# Patient Record
Sex: Male | Born: 1955 | Race: Black or African American | Hispanic: No | Smoking: Never smoker
Health system: Southern US, Community
[De-identification: ages and names within clinical notes are randomized; demographics above are authoritative.]

## PROBLEM LIST (undated history)

## (undated) DIAGNOSIS — Z8739 Personal history of other diseases of the musculoskeletal system and connective tissue: Secondary | ICD-10-CM

## (undated) DIAGNOSIS — Z973 Presence of spectacles and contact lenses: Secondary | ICD-10-CM

## (undated) DIAGNOSIS — M1990 Unspecified osteoarthritis, unspecified site: Secondary | ICD-10-CM

## (undated) DIAGNOSIS — H548 Legal blindness, as defined in USA: Secondary | ICD-10-CM

## (undated) DIAGNOSIS — Z8546 Personal history of malignant neoplasm of prostate: Secondary | ICD-10-CM

## (undated) DIAGNOSIS — J309 Allergic rhinitis, unspecified: Secondary | ICD-10-CM

## (undated) DIAGNOSIS — H409 Unspecified glaucoma: Secondary | ICD-10-CM

## (undated) DIAGNOSIS — J449 Chronic obstructive pulmonary disease, unspecified: Secondary | ICD-10-CM

## (undated) DIAGNOSIS — G4733 Obstructive sleep apnea (adult) (pediatric): Secondary | ICD-10-CM

## (undated) DIAGNOSIS — J45909 Unspecified asthma, uncomplicated: Secondary | ICD-10-CM

## (undated) DIAGNOSIS — N529 Male erectile dysfunction, unspecified: Secondary | ICD-10-CM

## (undated) DIAGNOSIS — Z9989 Dependence on other enabling machines and devices: Secondary | ICD-10-CM

## (undated) HISTORY — PX: GLAUCOMA SURGERY: SHX656

## (undated) HISTORY — PX: PROSTATECTOMY: SHX69

---

## 2005-02-05 ENCOUNTER — Emergency Department (HOSPITAL_COMMUNITY): Admission: EM | Admit: 2005-02-05 | Discharge: 2005-02-05 | Payer: Self-pay | Admitting: Family Medicine

## 2005-03-07 ENCOUNTER — Ambulatory Visit: Payer: Self-pay | Admitting: Internal Medicine

## 2005-03-20 ENCOUNTER — Ambulatory Visit: Payer: Self-pay | Admitting: *Deleted

## 2005-03-26 ENCOUNTER — Ambulatory Visit: Payer: Self-pay | Admitting: Internal Medicine

## 2005-06-13 ENCOUNTER — Ambulatory Visit: Payer: Self-pay | Admitting: Internal Medicine

## 2005-06-14 ENCOUNTER — Ambulatory Visit (HOSPITAL_COMMUNITY): Admission: RE | Admit: 2005-06-14 | Discharge: 2005-06-14 | Payer: Self-pay | Admitting: Internal Medicine

## 2005-06-18 ENCOUNTER — Ambulatory Visit: Payer: Self-pay | Admitting: Internal Medicine

## 2005-10-10 ENCOUNTER — Ambulatory Visit: Payer: Self-pay | Admitting: Internal Medicine

## 2006-06-08 ENCOUNTER — Emergency Department (HOSPITAL_COMMUNITY): Admission: EM | Admit: 2006-06-08 | Discharge: 2006-06-08 | Payer: Self-pay | Admitting: Emergency Medicine

## 2006-09-11 ENCOUNTER — Ambulatory Visit: Payer: Self-pay | Admitting: Internal Medicine

## 2006-09-12 ENCOUNTER — Inpatient Hospital Stay (HOSPITAL_COMMUNITY): Admission: EM | Admit: 2006-09-12 | Discharge: 2006-09-12 | Payer: Self-pay | Admitting: Emergency Medicine

## 2007-01-14 ENCOUNTER — Encounter (INDEPENDENT_AMBULATORY_CARE_PROVIDER_SITE_OTHER): Payer: Self-pay | Admitting: *Deleted

## 2007-04-17 ENCOUNTER — Ambulatory Visit: Payer: Self-pay | Admitting: Family Medicine

## 2007-04-20 ENCOUNTER — Ambulatory Visit: Payer: Self-pay | Admitting: Internal Medicine

## 2007-04-20 LAB — CONVERTED CEMR LAB
Total CHOL/HDL Ratio: 4.8
Triglycerides: 436 mg/dL — ABNORMAL HIGH (ref <150)

## 2007-05-04 ENCOUNTER — Ambulatory Visit: Payer: Self-pay | Admitting: Internal Medicine

## 2007-05-05 ENCOUNTER — Encounter: Payer: Self-pay | Admitting: Internal Medicine

## 2007-05-05 LAB — CONVERTED CEMR LAB
ALT: 33 units/L (ref 0–53)
Alkaline Phosphatase: 77 units/L (ref 39–117)
BUN: 10 mg/dL (ref 6–23)
Basophils Absolute: 0 10*3/uL (ref 0.0–0.1)
Basophils Relative: 0 % (ref 0–1)
CO2: 23 meq/L (ref 19–32)
Calcium: 9.4 mg/dL (ref 8.4–10.5)
Chloride: 107 meq/L (ref 96–112)
Eosinophils Relative: 7 % — ABNORMAL HIGH (ref 0–5)
Glucose, Bld: 94 mg/dL (ref 70–99)
HCT: 47.3 % (ref 39.0–52.0)
Hemoglobin: 15.3 g/dL (ref 13.0–17.0)
Lymphs Abs: 2.4 10*3/uL (ref 0.7–4.0)
MCHC: 32.3 g/dL (ref 30.0–36.0)
MCV: 95.9 fL (ref 78.0–100.0)
Neutro Abs: 4 10*3/uL (ref 1.7–7.7)
PSA: 3.86 ng/mL (ref 0.10–4.00)
RBC: 4.93 M/uL (ref 4.22–5.81)
RDW: 13.8 % (ref 11.5–15.5)
Total Protein: 7.7 g/dL (ref 6.0–8.3)

## 2007-06-12 ENCOUNTER — Emergency Department (HOSPITAL_COMMUNITY): Admission: EM | Admit: 2007-06-12 | Discharge: 2007-06-12 | Payer: Self-pay | Admitting: Emergency Medicine

## 2007-07-03 ENCOUNTER — Ambulatory Visit: Payer: Self-pay | Admitting: Family Medicine

## 2007-07-08 ENCOUNTER — Encounter: Payer: Self-pay | Admitting: Internal Medicine

## 2007-07-08 ENCOUNTER — Ambulatory Visit: Payer: Self-pay | Admitting: Family Medicine

## 2007-07-08 LAB — CONVERTED CEMR LAB
Cholesterol: 181 mg/dL (ref 0–200)
LDL Cholesterol: 105 mg/dL — ABNORMAL HIGH (ref 0–99)
VLDL: 31 mg/dL (ref 0–40)

## 2007-09-02 ENCOUNTER — Ambulatory Visit: Payer: Self-pay | Admitting: Internal Medicine

## 2008-01-06 ENCOUNTER — Encounter: Payer: Self-pay | Admitting: Family Medicine

## 2008-01-06 ENCOUNTER — Ambulatory Visit: Payer: Self-pay | Admitting: Internal Medicine

## 2008-01-06 LAB — CONVERTED CEMR LAB: Triglycerides: 166 mg/dL — ABNORMAL HIGH (ref <150)

## 2008-08-23 ENCOUNTER — Ambulatory Visit: Payer: Self-pay | Admitting: Internal Medicine

## 2008-10-17 ENCOUNTER — Ambulatory Visit: Payer: Self-pay | Admitting: Family Medicine

## 2009-02-02 ENCOUNTER — Ambulatory Visit: Payer: Self-pay | Admitting: Internal Medicine

## 2009-04-13 ENCOUNTER — Ambulatory Visit: Payer: Self-pay | Admitting: Internal Medicine

## 2009-10-19 ENCOUNTER — Ambulatory Visit: Payer: Self-pay | Admitting: Internal Medicine

## 2009-12-07 ENCOUNTER — Ambulatory Visit: Payer: Self-pay | Admitting: Internal Medicine

## 2010-03-30 ENCOUNTER — Emergency Department (HOSPITAL_COMMUNITY)
Admission: EM | Admit: 2010-03-30 | Discharge: 2010-03-30 | Payer: Self-pay | Source: Home / Self Care | Admitting: Emergency Medicine

## 2010-09-11 NOTE — H&P (Signed)
Derek Martinez, FURUKAWA NO.:  [id number]   MEDICAL RECORD NO.:  [id number]          PATIENT TYPE:  INP   LOCATION:  5727                         FACILITY:  [facility]   PHYSICIAN:  Ladell Pier, M.D.   DATE OF BIRTH:  11-09-1955   DATE OF ADMISSION:  09/11/2006  DATE OF DISCHARGE:                              HISTORY & PHYSICAL   CHIEF COMPLAINT:  Shortness of breath.   HISTORY OF PRESENT ILLNESS:  The patient is a 55 year old African  American male.  Past medical history significant for obstructive sleep  apnea, asthma, and allergic rhinitis.  Per patient, he has been out of  his medications for the past 8 months.  He recently was cutting his  grass and he also was doing some sawing.  After that he noted that he  has become increasingly short of breath.  He had no chest pain.   PAST MEDICAL HISTORY:  Significant for:  1. Asthma.  2. Obstructive sleep apnea, for which he uses a CPAP machine.  3. Allergic rhinitis.  4. Right shoulder dislocation followed by orthopedics.   FAMILY HISTORY:  Noncontributory.   SOCIAL HISTORY:  No tobacco or alcohol use.  He is single with a  significant other.  He has 2 children.  He is self employed.   MEDICATIONS:  He normally takes Advair and Singulair.   ALLERGIES:  PEANUT AND PENICILLIN.   REVIEW OF SYSTEMS:  As per stated in the HPI.   PHYSICAL EXAMINATION:  VITAL SIGNS:  Temperature 97.2, blood pressure  114/71, pulse 118, respirations 18, pulse ox 90% on room air.  HEENT:  Normocephalic, atraumatic.  Pupils equal, round, reactive to  light without erythema.  CARDIOVASCULAR:  Regular rate and rhythm.  LUNGS:  He has prolonged expiratory phase with some expiratory wheezes.  ABDOMEN:  Positive bowel sounds.  EXTREMITIES:  No edema.   IMAGING:  Chest x-ray shows no acute disease.   LABORATORY DATA:  Sodium 139, potassium 3.7, chloride 109, CO2 22, BUN  7, creatinine 0.6, glucose 147.  WBC 13.5, hemoglobin 14.9,  platelets  286.   ASSESSMENT:  1. Asthma exacerbation.  We will admit the patient to the hospital      with intravenous steroids.  I will start him on Z-Pak and albuterol      treatment every 6 hours.  We will also get his peaks q.shift.  2. Obstructive sleep apnea.  We will restart him on his continuous      positive airway pressure machine.  3. Allergic rhinitis.  Start him on Singulair daily.  4. The patient admitted for observation.      Ladell Pier, M.D.  Electronically Signed     NJ/MEDQ  D:  09/12/2006  T:  09/12/2006  Job:  [id number]

## 2010-09-11 NOTE — Discharge Summary (Signed)
[name], Derek NO.:  [id number]   MEDICAL RECORD NO.:  [id number]          PATIENT TYPE:  INP   LOCATION:  5727                         FACILITY:  [facility]   PHYSICIAN:  Altha Harm, MDDATE OF BIRTH:  Jan 28, 1956   DATE OF ADMISSION:  09/11/2006  DATE OF DISCHARGE:  09/12/2006                               DISCHARGE SUMMARY   DISCHARGE DISPOSITION:  Home.   FINAL DISCHARGE DIAGNOSES:  1. Acute exacerbation of asthma.  2. Obstructive sleep apnea.  3. Gastroesophageal reflux disease.  4. Allergic rhinitis.   DISCHARGE MEDICATIONS:  1. Singulair 10 mg p.o. q.a.m.  2. Albuterol MDI 2 puffs q.4 hours p.r.n.  3. Pulmicort 180 mcg per spray, 2 sprays p.o. b.i.d.  4. Zithromax 350 mg p.o. daily x4 days.  5. Prednisone 60 mg p.o. b.i.d. x5 days.  6. Prilosec 40 mg p.o. daily.   CONSULTATIONS:  None.   PROCEDURES:  None.   DIAGNOSTIC STUDIES:  A chest x-ray, which showed no evidence of acute  pneumonic process.   PRIMARY CARE PHYSICIAN:  [facility].   CHIEF COMPLAINT:  Difficulty in breathing.   HOSPITAL COURSE:  The patient was admitted with an acute exacerbation of  asthma.  The patient was started on nebulizer treatments with albuterol  and given IV Solu-Medrol.  The patient improved his breathing and  resolved his hypoxia.  The patient was transitioned to p.o. prednisone  and Advair.  The patient is able to ambulate without difficulty in  breathing, with O2 sats at rest and with ambulation remaining above 95%.  The patient is being discharged on the above medications.  The patient  has received Advair while hospitalized; however, states that he has no  insurance and is unable to afford his Advair as an outpatient.  The  patient will complete the use of the current Advair, and then transition  to albuterol and Pulmicort as his controlling medication.   CONDITION ON DISCHARGE:  Stable.   FOLLOWUP:  The patient is to followup with [facility] in 1 week.      Altha Harm, MD  Electronically Signed     MAM/MEDQ  D:  09/12/2006  T:  09/13/2006  Job:  [id number]

## 2011-08-12 ENCOUNTER — Emergency Department (HOSPITAL_COMMUNITY)
Admission: EM | Admit: 2011-08-12 | Discharge: 2011-08-12 | Disposition: A | Discharge status: Home / Self Care | Payer: Self-pay | Attending: Emergency Medicine | Admitting: Emergency Medicine

## 2011-08-12 ENCOUNTER — Emergency Department (HOSPITAL_COMMUNITY): Payer: Self-pay

## 2011-08-12 ENCOUNTER — Encounter (HOSPITAL_COMMUNITY): Payer: Self-pay

## 2011-08-12 DIAGNOSIS — N454 Abscess of epididymis or testis: Secondary | ICD-10-CM | POA: Insufficient documentation

## 2011-08-12 DIAGNOSIS — H409 Unspecified glaucoma: Secondary | ICD-10-CM | POA: Insufficient documentation

## 2011-08-12 DIAGNOSIS — H543 Unqualified visual loss, both eyes: Secondary | ICD-10-CM | POA: Insufficient documentation

## 2011-08-12 DIAGNOSIS — J45909 Unspecified asthma, uncomplicated: Secondary | ICD-10-CM | POA: Insufficient documentation

## 2011-08-12 LAB — BASIC METABOLIC PANEL
BUN: 6 mg/dL (ref 6–23)
Calcium: 9 mg/dL (ref 8.4–10.5)
Creatinine, Ser: 0.93 mg/dL (ref 0.50–1.35)
GFR calc Af Amer: >90 mL/min (ref >90)
GFR calc non Af Amer: >90 mL/min (ref >90)
Glucose, Bld: 107 mg/dL — ABNORMAL HIGH (ref 70–99)

## 2011-08-12 LAB — DIFFERENTIAL
Basophils Absolute: 0 10*3/uL (ref 0.0–0.1)
Basophils Relative: 0 % (ref 0–1)
Lymphocytes Relative: 15 % (ref 12–46)
Lymphs Abs: 1.4 10*3/uL (ref 0.7–4.0)
Monocytes Absolute: 0.6 10*3/uL (ref 0.1–1.0)
Monocytes Relative: 6 % (ref 3–12)
Neutro Abs: 7 10*3/uL (ref 1.7–7.7)
Neutrophils Relative %: 74 % (ref 43–77)

## 2011-08-12 LAB — CBC
MCH: 31.9 pg (ref 26.0–34.0)
MCHC: 33.5 g/dL (ref 30.0–36.0)
Platelets: 205 10*3/uL (ref 150–400)

## 2011-08-12 MED ORDER — DOXYCYCLINE HYCLATE 100 MG PO CAPS: 100.0000 mg | ORAL_CAPSULE | Freq: Two times a day (BID) | ORAL | Status: AC

## 2011-08-12 MED ORDER — OXYCODONE-ACETAMINOPHEN 5-325 MG PO TABS
1.0000 | ORAL_TABLET | Freq: Once | ORAL | Status: AC
  Administered 2011-08-12: 1 via ORAL
  Filled 2011-08-12: qty 1

## 2011-08-12 MED ORDER — OXYCODONE-ACETAMINOPHEN 5-325 MG PO TABS: 2.0000 | ORAL_TABLET | ORAL | Status: AC | PRN

## 2011-08-12 NOTE — ED Notes (Signed)
Pt has ride coming to pick up

## 2011-08-12 NOTE — ED Notes (Signed)
Returned from ultrasound.

## 2011-08-12 NOTE — ED Notes (Signed)
Patient reporting right sided testicle pain and swelling over past few days; patient reporting an oval shape to his testicle. Patient has history of BPH.

## 2011-08-12 NOTE — ED Notes (Signed)
Patient transported to Ultrasound 

## 2011-08-12 NOTE — ED Provider Notes (Signed)
History     CSN: [id number]  Arrival date & time 08/12/11  4098   First MD Initiated Contact with Patient 08/12/11 1104      Chief Complaint  Patient presents with  . Testicle Pain     Patient is a 56 y.o. male presenting with testicular pain. The history is provided by the patient.  Testicle Pain This is a new problem. The current episode started more than 2 days ago. The problem occurs constantly. The problem has been gradually worsening. Pertinent negatives include no chest pain, no abdominal pain and no shortness of breath. Exacerbated by: standing. The symptoms are relieved by rest. He has tried nothing for the symptoms.  pt reports gradually worsening testicle pain No trauma to groin No fever/vomiting/abd pain No dysuria He has no difficulty urinating Reports he has a "spot" on his scrotum   Past Medical History  Diagnosis Date  . Benign prostatic hypertrophy   . Asthma   . Blind   . Glaucoma     Past Surgical History  Procedure Date  . Glaucoma surgery     No family history on file.  History  Substance Use Topics  . Smoking status: Never Smoker   . Smokeless tobacco: Not on file  . Alcohol Use: Yes      Review of Systems  Respiratory: Negative for shortness of breath.   Cardiovascular: Negative for chest pain.  Gastrointestinal: Negative for abdominal pain.  Genitourinary: Positive for testicular pain.  All other systems reviewed and are negative.    Allergies  Penicillins  Home Medications   Current Outpatient Rx  Name Route Sig Dispense Refill  . ALBUTEROL SULFATE HFA 108 (90 BASE) MCG/ACT IN AERS Inhalation Inhale 2 puffs into the lungs every 6 (six) hours as needed. For shortness of breath    . PREDNISOLONE ACETATE 1 % OP SUSP Right Eye Place 1 drop into the right eye 3 (three) times daily.      BP 129/88  Pulse 82  Temp(Src) 99.6 F (37.6 C) (Oral)  Resp 18  SpO2 97%  Physical Exam CONSTITUTIONAL: Well developed/well  nourished HEAD AND FACE: Normocephalic/atraumatic EYES: EOMI/PERRL ENMT: Mucous membranes moist NECK: supple no meningeal signs SPINE:entire spine nontender CV: S1/S2 noted, no murmurs/rubs/gallops noted LUNGS: Lungs are clear to auscultation bilaterally, no apparent distress ABDOMEN: soft, nontender, no rebound or guarding GU:no cva tenderness Small area of induration to central scrotum without erythema/crepitance/drainage.  There is no testicle tenderness.  No hernia.  Chaperone present NEURO: Pt is awake/alert, moves all extremitiesx4 EXTREMITIES: pulses normal, full ROM SKIN: warm, color normal PSYCH: no abnormalities of mood noted  ED Course  Procedures    Labs Reviewed  CBC  DIFFERENTIAL  BASIC METABOLIC PANEL   11:91 PM Pt with possible scrotal abscess, [place] pending at this time and likely urology consult 1:10 PM D/w dr Annabell Howells concerning his [place] results Will see in office today   MDM  Nursing notes reviewed and considered in documentation labs/vitals reviewed and considered         Joya Gaskins, MD 08/12/11 1311

## 2011-08-12 NOTE — Discharge Instructions (Signed)
Please go directly to his office today

## 2011-08-12 NOTE — ED Notes (Signed)
Denies urinary sx

## 2011-09-03 ENCOUNTER — Ambulatory Visit (INDEPENDENT_AMBULATORY_CARE_PROVIDER_SITE_OTHER): Payer: Self-pay | Admitting: Pulmonary Disease

## 2011-09-03 ENCOUNTER — Encounter: Payer: Self-pay | Admitting: Pulmonary Disease

## 2011-09-03 VITALS — BP 112/78 | HR 80 | Temp 98.4°F | Ht 65.0 in | Wt 160.8 lb

## 2011-09-03 DIAGNOSIS — J45909 Unspecified asthma, uncomplicated: Secondary | ICD-10-CM | POA: Insufficient documentation

## 2011-09-03 DIAGNOSIS — G4733 Obstructive sleep apnea (adult) (pediatric): Secondary | ICD-10-CM

## 2011-09-03 NOTE — Assessment & Plan Note (Signed)
He has prior history of sleep apnea.  He had benefit from using CPAP therapy before, but unfortunately his machine broke and was never replaced.  He continues to have sleep disruption, snoring, witnessed apnea, and daytime sleepiness.  He likely still has sleep apnea.  I have explained how sleep apnea can affect the patient's health.  Driving precautions and importance of weight loss were discussed.  Treatment options for sleep apnea were reviewed.  To further assess will need to repeat in lab sleep study.

## 2011-09-03 NOTE — Patient Instructions (Signed)
Will schedule sleep study Will call to schedule follow up after sleep study reviewed 

## 2011-09-03 NOTE — Progress Notes (Signed)
Chief Complaint  Patient presents with  . Sleep Consult    Pt had sleep study over 10 years ago and has had current cpap that long--Pt has not used cpap mahcine in couple years bc it stopped working    History of Present Illness: Derek Martinez is a 56 y.o. male for evaluation of sleep apnea.  He had a sleep study in [place] 10 years ago.  He was told he had sleep apnea, and started on CPAP.  He felt this helped a lot.  His machine broke, and he was never supplied with a new machine.  He was seen by his PCP recently, and was noted to have continue difficulty with his sleep and energy level.  He was therefore referred to sleep medicine for further evaluation.  He does snore.  He will stop breathing while asleep.  He goes to bed at 10 pm, and falls asleep quickly.  He does not use anything to help him stay awake.  He wakes up several times to use the bathroom.  He gets out of bed at 6 am.  He feels tired in the morning.  He does not usually get headaches.  He will occasionally use an energy drink to help him get through the day.  His Epworth score is 19 out of 24.  The patient denies sleep walking, sleep talking, bruxism, or nightmares.  There is no history of restless legs.  The patient denies sleep hallucinations, sleep paralysis, or cataplexy.  His weight has been steady.  He is a Consulting civil engineer at [employer].  He is worried that his sleep problem is affecting his memory/concentration and performance at school.   Past Medical History  Diagnosis Date  . Benign prostatic hypertrophy   . Asthma   . Blind   . Glaucoma   . Deviated septum   . Seasonal allergies     Past Surgical History  Procedure Date  . Glaucoma surgery     x 5    Current Outpatient Prescriptions on File Prior to Visit  Medication Sig Dispense Refill  . albuterol (PROVENTIL HFA;VENTOLIN HFA) 108 (90 BASE) MCG/ACT inhaler Inhale 2 puffs into the lungs every 6 (six) hours as needed. For shortness of breath      . mometasone  (NASONEX) 50 MCG/ACT nasal spray Place 1 spray into the nose daily.      . prednisoLONE acetate (PRED FORTE) 1 % ophthalmic suspension Place 1 drop into the right eye 3 (three) times daily.        Allergies  Allergen Reactions  . Penicillins Other (See Comments)    unknown    family history includes Diabetes in his father; Glaucoma in his father and mother; and Sleep apnea in his father.   reports that he has never smoked. He does not have any smokeless tobacco history on file. He reports that he drinks alcohol. He reports that he does not use illicit drugs.  Review of Systems   Constitutional: Negative for fever, appetite change and unexpected weight change.  HENT: Negative for ear pain, congestion, sore throat, sneezing, trouble swallowing, dental problem and sinus pressure.   Respiratory: Positive for cough and shortness of breath.   Cardiovascular: Negative for chest pain, palpitations and leg swelling.  Gastrointestinal: Negative for abdominal pain.  Musculoskeletal: Negative for joint swelling.  Skin: Negative for rash.  Neurological: Negative for headaches.  Psychiatric/Behavioral: Negative for dysphoric mood. The patient is not nervous/anxious.     Physical Exam: BP 112/78  Pulse 80  Temp(Src) 98.4 F (36.9 C) (Oral)  Ht 5\' 5"  (1.651 m)  Wt 160 lb 12.8 oz (72.938 kg)  BMI 26.76 kg/m2  SpO2 96%  Body mass index is 26.76 kg/(m^2).  General - No distress HEENT - no sinus tenderness, no oral exudate, MP 3, no LAN Cardiac - s1s2 regular  Chest - no wheeze/rales Abdomen - soft, nontender Extremities - no e/c/c Neurologic - normal strength Skin - no rashes Psychiatric - normal mood, behavior  Assessment/Plan:  Outpatient Encounter Prescriptions as of 09/03/2011  Medication Sig Dispense Refill  . albuterol (PROVENTIL HFA;VENTOLIN HFA) 108 (90 BASE) MCG/ACT inhaler Inhale 2 puffs into the lungs every 6 (six) hours as needed. For shortness of breath      . mometasone  (NASONEX) 50 MCG/ACT nasal spray Place 1 spray into the nose daily.      . prednisoLONE acetate (PRED FORTE) 1 % ophthalmic suspension Place 1 drop into the right eye 3 (three) times daily.        Seana Underwood Pager:  [phone] 09/03/2011, 3:31 PM

## 2011-09-03 NOTE — Assessment & Plan Note (Signed)
He will f/u with his primary physician.  Advised that we could assist with his asthma care if this is needed.

## 2011-09-03 NOTE — Progress Notes (Deleted)
  Subjective:    Patient ID: Derek Martinez, male    DOB: [birth year], 56 y.o.   MRN: [id number]  HPI    Review of Systems  Constitutional: Negative for fever, appetite change and unexpected weight change.  HENT: Negative for ear pain, congestion, sore throat, sneezing, trouble swallowing, dental problem and sinus pressure.   Respiratory: Positive for cough and shortness of breath.   Cardiovascular: Negative for chest pain, palpitations and leg swelling.  Gastrointestinal: Negative for abdominal pain.  Musculoskeletal: Negative for joint swelling.  Skin: Negative for rash.  Neurological: Negative for headaches.  Psychiatric/Behavioral: Negative for dysphoric mood. The patient is not nervous/anxious.        Objective:   Physical Exam        Assessment & Plan:

## 2011-09-25 ENCOUNTER — Encounter (HOSPITAL_BASED_OUTPATIENT_CLINIC_OR_DEPARTMENT_OTHER): Payer: Self-pay

## 2011-10-15 ENCOUNTER — Telehealth: Payer: Self-pay | Admitting: Internal Medicine

## 2011-10-15 ENCOUNTER — Ambulatory Visit (HOSPITAL_BASED_OUTPATIENT_CLINIC_OR_DEPARTMENT_OTHER): Payer: Self-pay | Attending: Pulmonary Disease | Admitting: Radiology

## 2011-10-15 VITALS — Ht 67.0 in | Wt 175.0 lb

## 2011-10-15 DIAGNOSIS — G4733 Obstructive sleep apnea (adult) (pediatric): Secondary | ICD-10-CM | POA: Insufficient documentation

## 2011-10-15 NOTE — Telephone Encounter (Signed)
I saw him once for evaluation of sleep apnea 09/03/11.  He was to have sleep study scheduled, but it does not appear this has been done yet.    He has history of asthma, and this is being managed by his PCP.  I have not evaluated him for asthma.  If he is having trouble with his asthma, then he needs to address through his PCP.

## 2011-10-15 NOTE — Telephone Encounter (Signed)
He just called [facility]. He left his paper work and did not know where to go. Called me to get directions.

## 2011-10-15 NOTE — Telephone Encounter (Signed)
Got page from patient through answering svc.  Called [phone] several times. No answer.

## 2011-10-27 DIAGNOSIS — G4733 Obstructive sleep apnea (adult) (pediatric): Secondary | ICD-10-CM

## 2011-10-27 NOTE — Procedures (Signed)
NAME:  POLO, MCMARTIN NO.:  [id number]  MEDICAL RECORD NO.:  [id number]          PATIENT TYPE:  OUT  LOCATION:  SLEEP CENTER                 FACILITY:  [facility]  PHYSICIAN:  Coralyn Helling, MD        DATE OF BIRTH:  Jan 04, 1956  DATE OF STUDY:  10/15/2011                           NOCTURNAL POLYSOMNOGRAM  REFERRING PHYSICIAN:  Coralyn Helling, MD  FACILITY:  [facility].  REFERRING PHYSICIAN:  Coralyn Helling, MD  INDICATION:  Mr. Bozzi is a 56 year old male, who was diagnosed with obstructive sleep apnea while living in [place] approximately 10 years ago.  He was successfully controlled on CPAP therapy.  However, his CPAP machine broke several years ago and he has been without CPAP therapy recently.  He continues to have snoring sleep disruption and daytime sleepiness.  He is therefore referred to the sleep lab for further evaluation of hypersomnia with obstructive sleep apnea.  Height is 5 feet 7 inches.  Weight is 175 pounds BMI is 27, neck size is 15 inches.  MEDICATIONS:  Albuterol and Advair.  EPWORTH SLEEPINESS SCORE:  20.  SLEEP ARCHITECTURE:  Total recording time was 380 minutes, total sleep time was 344 minutes, sleep efficiency was 90%.  Sleep latency was 9 minutes.  REM latency was 55 minutes.  The study was notable for lack of slow-wave sleep and he slept predominantly in the supine position.  RESPIRATORY DATA:  The average respiratory rate was 18.  Moderate snoring was noted by the technician.  The overall apnea-hypopnea index was 7.8.  There was 1 central apneic event.  The remainder of the events were obstructive in nature.  The REM apnea-hypopnea index was 24.  OXYGEN DATA:  The baseline oxygenation was 93%.  The oxygen saturation nadir was 78%.  The patient spent a total of 9.5 minutes with an oxygen saturation below 88%.  The study was conducted without the use of supplemental oxygen.  CARDIAC DATA:  The average heart rate was 71  and the rhythm strip showed normal sinus rhythm.  MOVEMENT PARASOMNIA:  The patient had 1 restroom trip and the periodic limb movement index was 0.5.  IMPRESSION:  This study shows evidence for mild obstructive sleep apnea with an apnea-hypopnea index of 7.8 and oxygen saturation nadir of 70%. He did have a significant REM effect to sleep-disordered breathing.  Additional therapeutic interventions at this time could include CPAP therapy, oral appliance, or surgical intervention.     Coralyn Helling, MD Diplomat, American Board of Sleep Medicine    VS/MEDQ  D:  10/27/2011 12:02:35  T:  10/27/2011 16:10:96  Job:  [id number]

## 2011-10-29 ENCOUNTER — Telehealth: Payer: Self-pay | Admitting: Pulmonary Disease

## 2011-10-29 DIAGNOSIS — G4733 Obstructive sleep apnea (adult) (pediatric): Secondary | ICD-10-CM

## 2011-10-29 NOTE — Telephone Encounter (Signed)
PSG 10/15/11>>AHI 7.8 (REM 24), SpO2 low 78%, PLMI 0.5.  Will have my nurse schedule ROV to review results.

## 2011-10-29 NOTE — Telephone Encounter (Signed)
Pt is scheduled to come in 11/21/11 at 1:30. Pt aware of results

## 2011-11-21 ENCOUNTER — Ambulatory Visit (INDEPENDENT_AMBULATORY_CARE_PROVIDER_SITE_OTHER): Payer: Self-pay | Admitting: Pulmonary Disease

## 2011-11-21 ENCOUNTER — Encounter: Payer: Self-pay | Admitting: Pulmonary Disease

## 2011-11-21 VITALS — BP 122/70 | HR 81 | Temp 98.1°F | Ht 65.0 in | Wt 168.4 lb

## 2011-11-21 DIAGNOSIS — G4733 Obstructive sleep apnea (adult) (pediatric): Secondary | ICD-10-CM

## 2011-11-21 NOTE — Patient Instructions (Signed)
Will arrange for CPAP set up Follow up in 2 months 

## 2011-11-21 NOTE — Progress Notes (Signed)
Chief Complaint  Patient presents with  . Follow-up    Pt here to discuss sleep study results.    History of Present Illness: Derek Martinez is a 56 y.o. male with mild OSA.  He is here to review PSG from 10/15/11>>AHI 7.8 (REM 24), SpO2 low 78%, PLMI 0.5.   Past Medical History  Diagnosis Date  . Benign prostatic hypertrophy   . Asthma   . Blind   . Glaucoma   . Deviated septum   . Seasonal allergies     Past Surgical History  Procedure Date  . Glaucoma surgery     x 5    Outpatient Encounter Prescriptions as of 11/21/2011  Medication Sig Dispense Refill  . albuterol (PROVENTIL HFA;VENTOLIN HFA) 108 (90 BASE) MCG/ACT inhaler Inhale 2 puffs into the lungs every 6 (six) hours as needed. For shortness of breath      . mometasone (NASONEX) 50 MCG/ACT nasal spray Place 1 spray into the nose daily.      . prednisoLONE acetate (PRED FORTE) 1 % ophthalmic suspension Place 1 drop into the right eye 3 (three) times daily.        Allergies  Allergen Reactions  . Penicillins Other (See Comments)    unknown    Physical Exam:  Blood pressure 122/70, pulse 81, temperature 98.1 F (36.7 C), temperature source Oral, height 5\' 5"  (1.651 m), weight 168 lb 6.4 oz (76.386 kg), SpO2 97.00%.  Body mass index is 28.02 kg/(m^2). Wt Readings from Last 2 Encounters:  11/21/11 168 lb 6.4 oz (76.386 kg)  10/15/11 175 lb (79.379 kg)    General - No distress  HEENT - no sinus tenderness, no oral exudate, MP 3, no LAN  Cardiac - s1s2 regular  Chest - no wheeze/rales  Abdomen - soft, nontender  Extremities - no e/c/c  Neurologic - normal strength  Skin - no rashes  Psychiatric - normal mood, behavior    Assessment/Plan:  Coralyn Helling, MD [place] [facility]/Sleep Pager:  [phone] 11/21/2011, 1:34 PM

## 2011-11-21 NOTE — Assessment & Plan Note (Signed)
He has mild sleep apnea with REM predominance.  I have reviewed his sleep test results with the patient.  Explained how sleep apnea can affect the patient's health.  Driving precautions and importance of weight loss were discussed.  Treatment options for sleep apnea were reviewed.  Will arrange for auto CPAP set up.

## 2012-01-22 ENCOUNTER — Ambulatory Visit: Payer: Self-pay | Admitting: Pulmonary Disease

## 2012-04-16 ENCOUNTER — Ambulatory Visit (INDEPENDENT_AMBULATORY_CARE_PROVIDER_SITE_OTHER): Payer: Medicare Other | Admitting: Pulmonary Disease

## 2012-04-16 ENCOUNTER — Encounter: Payer: Self-pay | Admitting: Pulmonary Disease

## 2012-04-16 VITALS — BP 142/80 | HR 74 | Temp 98.0°F | Ht 64.0 in | Wt 174.4 lb

## 2012-04-16 DIAGNOSIS — G4733 Obstructive sleep apnea (adult) (pediatric): Secondary | ICD-10-CM

## 2012-04-16 DIAGNOSIS — J309 Allergic rhinitis, unspecified: Secondary | ICD-10-CM | POA: Insufficient documentation

## 2012-04-16 DIAGNOSIS — J45909 Unspecified asthma, uncomplicated: Secondary | ICD-10-CM

## 2012-04-16 MED ORDER — ALBUTEROL SULFATE (2.5 MG/3ML) 0.083% IN NEBU: 2.5000 mg | INHALATION_SOLUTION | Freq: Four times a day (QID) | RESPIRATORY_TRACT | Status: DC

## 2012-04-16 MED ORDER — MOMETASONE FUROATE 50 MCG/ACT NA SUSP: 1.0000 | Freq: Every day | NASAL | Status: DC

## 2012-04-16 MED ORDER — FLUTICASONE-SALMETEROL 250-50 MCG/DOSE IN AEPB: 1.0000 | INHALATION_SPRAY | Freq: Two times a day (BID) | RESPIRATORY_TRACT | Status: DC

## 2012-04-16 NOTE — Assessment & Plan Note (Addendum)
He reports benefit from therapy, and compliance with CPAP.  Will get his current download, and call him with results.  Advised him to inform his surgical team about his diagnosis of sleep apnea prior to his prostate surgery.

## 2012-04-16 NOTE — Patient Instructions (Signed)
Will arrange for home nebulizer Follow up in 8 weeks with chest xray and PFT

## 2012-04-16 NOTE — Assessment & Plan Note (Signed)
Will refill his nasonex.  Will need to re-assess his status at next visit, and then determine if additional allergy testing is needed.

## 2012-04-16 NOTE — Assessment & Plan Note (Signed)
He has requested to have his asthma care transferred here.  Will have him change advair to BID.  Advised him to rinse his mouth after using advair.  He is to continue prn albuterol.  Will provide home nebulizer with albuterol.  Will plan to repeat chest xray and PFT at next visit.

## 2012-04-16 NOTE — Progress Notes (Signed)
Chief Complaint  Patient presents with  . Follow-up    wears cpap everynight x 5-8 hrs a night. no problems w/ mask/machine.   . Asthma    pt is wanting nebulizer medication/machine. pt has had to [facility] ED the past year 3 times.     History of Present Illness: Derek Martinez is a 56 y.o. male with OSA, and asthma.  He was previously followed at [facility] for his asthma.  He does not have anyone regularly following him for this now.  He has need to go to the ER several times for his asthma to get nebulizer therapy.  His breathing is doing better now.  He has occasional cough.  He uses advair once per day.  This is what he was told to do.  He is not needing albuterol much.  He ran out of nasonex, and has notice more sinus congestion.  He is using his CPAP, and this is working well.  He was recent diagnosed with prostate cancer, and is scheduled to have surgery at [facility] 12/20.  TESTS: PSG from 10/15/11>>AHI 7.8 (REM 24), SpO2 low 78%, PLMI 0.5.   Past Medical History  Diagnosis Date  . Benign prostatic hypertrophy   . Asthma   . Blind   . Glaucoma(365)   . Deviated septum   . Seasonal allergies   . OSA (obstructive sleep apnea)         . Prostate cancer     Past Surgical History  Procedure Date  . Glaucoma surgery     x 5    Outpatient Encounter Prescriptions as of 04/16/2012  Medication Sig Dispense Refill  . albuterol (PROVENTIL HFA;VENTOLIN HFA) 108 (90 BASE) MCG/ACT inhaler Inhale 2 puffs into the lungs every 6 (six) hours as needed. For shortness of breath      . Fluticasone-Salmeterol (ADVAIR) 250-50 MCG/DOSE AEPB Inhale 1 puff into the lungs 2 (two) times daily.  60 each  5  . mometasone (NASONEX) 50 MCG/ACT nasal spray Place 1 spray into the nose daily.  17 g  5  . [DISCONTINUED] Fluticasone-Salmeterol (ADVAIR) 250-50 MCG/DOSE AEPB Inhale 1 puff into the lungs daily.      . [DISCONTINUED] mometasone (NASONEX) 50 MCG/ACT nasal spray Place 1 spray into the nose  daily.      Marland Kitchen albuterol (PROVENTIL) (2.5 MG/3ML) 0.083% nebulizer solution Take 3 mLs (2.5 mg total) by nebulization 4 (four) times daily.  75 mL  12  . prednisoLONE acetate (PRED FORTE) 1 % ophthalmic suspension Place 1 drop into the right eye 3 (three) times daily.        Allergies  Allergen Reactions  . Penicillins Other (See Comments)    unknown    Physical Exam:  Filed Vitals:   04/16/12 1210  BP: 142/80  Pulse: 74  Temp: 98 F (36.7 C)  TempSrc: Oral  Height: 5\' 4"  (1.626 m)  Weight: 174 lb 6.4 oz (79.107 kg)  SpO2: 96%     Current Encounter SPO2  04/16/12 1210 96%  11/21/11 1334 97%  09/03/11 1503 96%     Body mass index is 29.94 kg/(m^2).   Wt Readings from Last 2 Encounters:  04/16/12 174 lb 6.4 oz (79.107 kg)  11/21/11 168 lb 6.4 oz (76.386 kg)     General - No distress ENT - No sinus tenderness, rhyphema, MP 3no oral exudate, no LAN Cardiac - s1s2 regular, no murmur Chest - No wheeze/rales/dullness Back - No focal tenderness Abd - Soft, non-tender  Ext - No edema Neuro - Normal strength Skin - No rashes Psych - normal mood, and behavior   Assessment/Plan:  Coralyn Helling, MD [place] [facility]/Critical Care/Sleep Pager:  [phone] 04/16/2012, 12:43 PM

## 2012-06-11 ENCOUNTER — Ambulatory Visit: Payer: Medicare Other | Admitting: Pulmonary Disease

## 2012-07-07 ENCOUNTER — Ambulatory Visit: Payer: Medicare Other | Admitting: Pulmonary Disease

## 2013-04-06 ENCOUNTER — Encounter (HOSPITAL_BASED_OUTPATIENT_CLINIC_OR_DEPARTMENT_OTHER): Payer: Self-pay | Admitting: *Deleted

## 2013-04-06 ENCOUNTER — Other Ambulatory Visit: Payer: Self-pay | Admitting: Foot & Ankle Surgery

## 2013-04-06 NOTE — Progress Notes (Signed)
PT IS LEGALLY BLIND.  NPO AFTER MN WITH EXCEPTION WATER/ GATORADE UNTIL 0700. ARRIVE AT 1145. NEEDS HG AND CXR. WILL BRING RESCUE INHALER.

## 2013-04-07 ENCOUNTER — Ambulatory Visit (HOSPITAL_BASED_OUTPATIENT_CLINIC_OR_DEPARTMENT_OTHER)
Admission: RE | Admit: 2013-04-07 | Discharge: 2013-04-07 | Disposition: A | Discharge status: Home / Self Care | Payer: Medicare Other | Source: Ambulatory Visit | Attending: Foot & Ankle Surgery | Admitting: Foot & Ankle Surgery

## 2013-04-07 ENCOUNTER — Encounter (HOSPITAL_BASED_OUTPATIENT_CLINIC_OR_DEPARTMENT_OTHER)
Admission: RE | Disposition: A | Discharge status: Home / Self Care | Payer: Self-pay | Source: Ambulatory Visit | Attending: Foot & Ankle Surgery

## 2013-04-07 ENCOUNTER — Ambulatory Visit (HOSPITAL_BASED_OUTPATIENT_CLINIC_OR_DEPARTMENT_OTHER): Payer: Medicare Other | Admitting: Certified Registered"

## 2013-04-07 ENCOUNTER — Encounter (HOSPITAL_BASED_OUTPATIENT_CLINIC_OR_DEPARTMENT_OTHER): Payer: Self-pay

## 2013-04-07 ENCOUNTER — Ambulatory Visit (HOSPITAL_COMMUNITY): Payer: Medicare Other

## 2013-04-07 ENCOUNTER — Encounter (HOSPITAL_BASED_OUTPATIENT_CLINIC_OR_DEPARTMENT_OTHER): Payer: Medicare Other | Admitting: Certified Registered"

## 2013-04-07 ENCOUNTER — Other Ambulatory Visit: Payer: Self-pay | Admitting: Foot & Ankle Surgery

## 2013-04-07 DIAGNOSIS — M21619 Bunion of unspecified foot: Secondary | ICD-10-CM | POA: Diagnosis present

## 2013-04-07 DIAGNOSIS — G4730 Sleep apnea, unspecified: Secondary | ICD-10-CM | POA: Insufficient documentation

## 2013-04-07 DIAGNOSIS — J4489 Other specified chronic obstructive pulmonary disease: Secondary | ICD-10-CM | POA: Insufficient documentation

## 2013-04-07 DIAGNOSIS — H409 Unspecified glaucoma: Secondary | ICD-10-CM | POA: Insufficient documentation

## 2013-04-07 DIAGNOSIS — M2010 Hallux valgus (acquired), unspecified foot: Secondary | ICD-10-CM | POA: Insufficient documentation

## 2013-04-07 DIAGNOSIS — H548 Legal blindness, as defined in USA: Secondary | ICD-10-CM | POA: Insufficient documentation

## 2013-04-07 DIAGNOSIS — J449 Chronic obstructive pulmonary disease, unspecified: Secondary | ICD-10-CM | POA: Diagnosis not present

## 2013-04-07 HISTORY — DX: Male erectile dysfunction, unspecified: N52.9

## 2013-04-07 HISTORY — DX: Presence of spectacles and contact lenses: Z97.3

## 2013-04-07 HISTORY — DX: Allergic rhinitis, unspecified: J30.9

## 2013-04-07 HISTORY — DX: Unspecified osteoarthritis, unspecified site: M19.90

## 2013-04-07 HISTORY — DX: Unspecified glaucoma: H40.9

## 2013-04-07 HISTORY — DX: Unspecified asthma, uncomplicated: J45.909

## 2013-04-07 HISTORY — PX: PROXIMAL INTERPHALANGEAL FUSION (PIP): SHX6043

## 2013-04-07 HISTORY — PX: OSTECTOMY: SHX6439

## 2013-04-07 HISTORY — DX: Personal history of other diseases of the musculoskeletal system and connective tissue: Z87.39

## 2013-04-07 HISTORY — DX: Legal blindness, as defined in USA: H54.8

## 2013-04-07 HISTORY — DX: Personal history of malignant neoplasm of prostate: Z85.46

## 2013-04-07 HISTORY — PX: BUNIONECTOMY: SHX129

## 2013-04-07 HISTORY — DX: Obstructive sleep apnea (adult) (pediatric): G47.33

## 2013-04-07 HISTORY — DX: Dependence on other enabling machines and devices: Z99.89

## 2013-04-07 HISTORY — PX: METATARSAL OSTEOTOMY: SHX1641

## 2013-04-07 HISTORY — DX: Chronic obstructive pulmonary disease, unspecified: J44.9

## 2013-04-07 LAB — POCT HEMOGLOBIN-HEMACUE: Hemoglobin: 15.3 g/dL (ref 13.0–17.0)

## 2013-04-07 SURGERY — BUNIONECTOMY
Anesthesia: General | Laterality: Left

## 2013-04-07 MED ORDER — OXYCODONE-ACETAMINOPHEN 5-325 MG PO TABS
1.0000 | ORAL_TABLET | ORAL | Status: DC | PRN
  Administered 2013-04-07: 1 via ORAL
  Filled 2013-04-07: qty 2

## 2013-04-07 MED ORDER — OXYCODONE-ACETAMINOPHEN 5-325 MG PO TABS
ORAL_TABLET | ORAL | Status: AC
  Filled 2013-04-07: qty 1

## 2013-04-07 MED ORDER — PROMETHAZINE HCL 12.5 MG PO TABS
12.5000 mg | ORAL_TABLET | ORAL | Status: DC | PRN
  Filled 2013-04-07: qty 1

## 2013-04-07 MED ORDER — LACTATED RINGERS IV SOLN
INTRAVENOUS | Status: DC
  Administered 2013-04-07 (×3): via INTRAVENOUS
  Filled 2013-04-07: qty 1000

## 2013-04-07 MED ORDER — PROMETHAZINE HCL 25 MG/ML IJ SOLN
6.2500 mg | INTRAMUSCULAR | Status: DC | PRN
  Filled 2013-04-07: qty 1

## 2013-04-07 MED ORDER — SODIUM CHLORIDE 0.9 % IJ SOLN
3.0000 mL | INTRAMUSCULAR | Status: DC | PRN
  Filled 2013-04-07: qty 3

## 2013-04-07 MED ORDER — FENTANYL CITRATE 0.05 MG/ML IJ SOLN
INTRAMUSCULAR | Status: DC | PRN
  Administered 2013-04-07: 50 ug via INTRAVENOUS
  Administered 2013-04-07: 25 ug via INTRAVENOUS
  Administered 2013-04-07: 50 ug via INTRAVENOUS
  Administered 2013-04-07 (×3): 25 ug via INTRAVENOUS

## 2013-04-07 MED ORDER — HYDROMORPHONE HCL PF 1 MG/ML IJ SOLN
0.2500 mg | INTRAMUSCULAR | Status: DC | PRN
  Filled 2013-04-07: qty 1

## 2013-04-07 MED ORDER — MORPHINE SULFATE 2 MG/ML IJ SOLN
2.0000 mg | INTRAMUSCULAR | Status: DC | PRN
  Filled 2013-04-07: qty 1

## 2013-04-07 MED ORDER — SODIUM CHLORIDE 0.9 % IJ SOLN
3.0000 mL | Freq: Two times a day (BID) | INTRAMUSCULAR | Status: DC
  Filled 2013-04-07: qty 3

## 2013-04-07 MED ORDER — LABETALOL HCL 5 MG/ML IV SOLN
INTRAVENOUS | Status: DC | PRN
  Administered 2013-04-07 (×2): 5 mg via INTRAVENOUS

## 2013-04-07 MED ORDER — ONDANSETRON HCL 4 MG/2ML IJ SOLN
INTRAMUSCULAR | Status: DC | PRN
  Administered 2013-04-07: 4 mg via INTRAVENOUS

## 2013-04-07 MED ORDER — HYDROCODONE-ACETAMINOPHEN 5-325 MG PO TABS
1.0000 | ORAL_TABLET | ORAL | Status: DC | PRN
  Filled 2013-04-07: qty 1

## 2013-04-07 MED ORDER — BUPIVACAINE LIPOSOME 1.3 % IJ SUSP
INTRAMUSCULAR | Status: DC | PRN
  Administered 2013-04-07: 20 mL

## 2013-04-07 MED ORDER — ACETAMINOPHEN 325 MG PO TABS
650.0000 mg | ORAL_TABLET | ORAL | Status: DC | PRN
  Filled 2013-04-07: qty 2

## 2013-04-07 MED ORDER — VANCOMYCIN HCL 10 G IV SOLR: 1000.0000 mg | Freq: Once | INTRAVENOUS | Status: DC

## 2013-04-07 MED ORDER — MORPHINE SULFATE 4 MG/ML IJ SOLN
4.0000 mg | INTRAMUSCULAR | Status: DC | PRN
  Filled 2013-04-07: qty 1

## 2013-04-07 MED ORDER — PROPOFOL 10 MG/ML IV BOLUS
INTRAVENOUS | Status: DC | PRN
  Administered 2013-04-07: 200 mg via INTRAVENOUS

## 2013-04-07 MED ORDER — DEXAMETHASONE SODIUM PHOSPHATE 4 MG/ML IJ SOLN
INTRAMUSCULAR | Status: DC | PRN
  Administered 2013-04-07: 10 mg via INTRAVENOUS

## 2013-04-07 MED ORDER — KETOROLAC TROMETHAMINE 30 MG/ML IJ SOLN
15.0000 mg | Freq: Once | INTRAMUSCULAR | Status: DC | PRN
  Filled 2013-04-07: qty 1

## 2013-04-07 MED ORDER — FENTANYL CITRATE 0.05 MG/ML IJ SOLN
INTRAMUSCULAR | Status: AC
  Filled 2013-04-07: qty 4

## 2013-04-07 MED ORDER — TRAZODONE 25 MG HALF TABLET
25.0000 mg | ORAL_TABLET | Freq: Every evening | ORAL | Status: DC | PRN
  Filled 2013-04-07: qty 1

## 2013-04-07 MED ORDER — SODIUM CHLORIDE 0.9 % IV SOLN
250.0000 mL | INTRAVENOUS | Status: DC | PRN
  Filled 2013-04-07: qty 250

## 2013-04-07 MED ORDER — LIDOCAINE HCL (CARDIAC) 20 MG/ML IV SOLN
INTRAVENOUS | Status: DC | PRN
  Administered 2013-04-07: 80 mg via INTRAVENOUS

## 2013-04-07 MED ORDER — VANCOMYCIN HCL IN DEXTROSE 1-5 GM/200ML-% IV SOLN
1000.0000 mg | Freq: Once | INTRAVENOUS | Status: AC
  Administered 2013-04-07: 1000 mg via INTRAVENOUS
  Filled 2013-04-07: qty 200

## 2013-04-07 MED ORDER — SODIUM CHLORIDE 0.9 % IR SOLN
Status: DC | PRN
  Administered 2013-04-07: 16:00:00

## 2013-04-07 MED ORDER — VANCOMYCIN HCL IN DEXTROSE 1-5 GM/200ML-% IV SOLN
1000.0000 mg | INTRAVENOUS | Status: AC
  Administered 2013-04-07: 1000 mg via INTRAVENOUS
  Filled 2013-04-07: qty 200

## 2013-04-07 SURGICAL SUPPLY — 77 items
BANDAGE COBAN STERILE 2 (GAUZE/BANDAGES/DRESSINGS) ×2 IMPLANT
BENZOIN TINCTURE PRP APPL 2/3 (GAUZE/BANDAGES/DRESSINGS) ×2 IMPLANT
BLADE AVERAGE 25X9 (BLADE) ×2 IMPLANT
BLADE MICRO SAGITTAL (BLADE) ×2 IMPLANT
BLADE OSC/SAG .038X5.5 CUT EDG (BLADE) ×2 IMPLANT
BLADE SURG 15 STRL LF DISP TIS (BLADE) ×9 IMPLANT
BLADE SURG 15 STRL SS (BLADE) ×9
BNDG COHESIVE 3X5 TAN STRL LF (GAUZE/BANDAGES/DRESSINGS) ×2 IMPLANT
BNDG CONFORM 2 STRL LF (GAUZE/BANDAGES/DRESSINGS) ×2 IMPLANT
BNDG ESMARK 4X9 LF (GAUZE/BANDAGES/DRESSINGS) ×2 IMPLANT
CLIP EZ 15X15X15 (Clip) ×2 IMPLANT
CLOTH BEACON ORANGE TIMEOUT ST (SAFETY) ×2 IMPLANT
COVER LIGHT HANDLE  1/PK (MISCELLANEOUS) ×1
COVER LIGHT HANDLE 1/PK (MISCELLANEOUS) ×1 IMPLANT
COVER MAYO STAND STRL (DRAPES) ×2 IMPLANT
COVER TABLE BACK 60X90 (DRAPES) ×2 IMPLANT
DRAPE EXTREMITY T 121X128X90 (DRAPE) ×2 IMPLANT
DRAPE OEC MINIVIEW 54X84 (DRAPES) ×2 IMPLANT
DURAPREP 26ML APPLICATOR (WOUND CARE) ×2 IMPLANT
ELECT REM PT RETURN 9FT ADLT (ELECTROSURGICAL) ×2
ELECTRODE REM PT RTRN 9FT ADLT (ELECTROSURGICAL) ×1 IMPLANT
Eazyclip compression staple ×2 IMPLANT
GAUZE SPONGE 4X4 12PLY STRL LF (GAUZE/BANDAGES/DRESSINGS) IMPLANT
GAUZE SPONGE 4X4 16PLY XRAY LF (GAUZE/BANDAGES/DRESSINGS) ×2 IMPLANT
GAUZE XEROFORM 1X8 LF (GAUZE/BANDAGES/DRESSINGS) ×4 IMPLANT
GLOVE BIO SURGEON STRL SZ7 (GLOVE) ×2 IMPLANT
GLOVE BIO SURGEON STRL SZ7.5 (GLOVE) ×2 IMPLANT
GLOVE BIOGEL PI IND STRL 7.5 (GLOVE) ×1 IMPLANT
GLOVE BIOGEL PI INDICATOR 7.5 (GLOVE) ×1
GLOVE INDICATOR 7.0 STRL GRN (GLOVE) ×2 IMPLANT
GLOVE INDICATOR 7.5 STRL GRN (GLOVE) ×2 IMPLANT
GOWN PREVENTION PLUS LG XLONG (DISPOSABLE) ×2 IMPLANT
GOWN PREVENTION PLUS XLARGE (GOWN DISPOSABLE) ×4 IMPLANT
GUIDEWIRE ORTH 6X062XTROC NS (WIRE) ×1 IMPLANT
K-WIRE .062 (WIRE) ×1
K-WIRE 0.8X100 (Wire) ×2 IMPLANT
K-WIRE 1.2X100 (WIRE) ×2
K-WIRE CAPS STERILE WHITE .045 (WIRE) ×2 IMPLANT
KWIRE ×4 IMPLANT
KWIRE 1.2X100 (WIRE) ×1 IMPLANT
KWIRE 4.0 X .045IN (WIRE) IMPLANT
LOOP VESSEL MAXI BLUE (MISCELLANEOUS) ×2 IMPLANT
MICRO SAGITTAL BLADE ×2 IMPLANT
NEEDLE HYPO 22GX1.5 SAFETY (NEEDLE) ×2 IMPLANT
NS IRRIG 500ML POUR BTL (IV SOLUTION) ×2 IMPLANT
PACK BASIN DAY SURGERY FS (CUSTOM PROCEDURE TRAY) ×2 IMPLANT
PADDING CAST ABS 4INX4YD NS (CAST SUPPLIES) ×1
PADDING CAST ABS COTTON 4X4 ST (CAST SUPPLIES) ×1 IMPLANT
PENCIL BUTTON HOLSTER BLD 10FT (ELECTRODE) ×2 IMPLANT
PIN CAPS ORTHO GREEN .062 (PIN) ×2 IMPLANT
PIN SMOOTH 2.5MM (Pin) ×4 IMPLANT
SCREW CANN 3.0X24MM TI (Screw) ×1 IMPLANT
SCREW CANNULATED 2.0X15MM (Screw) ×2 IMPLANT
SCREW CANNULATED 3.0X24MM (Screw) ×2 IMPLANT
SCREW SNAP OFF 2.0X14 (Screw) ×2 IMPLANT
SCREW SNAP OFF 2.0X16MM (Screw) ×2 IMPLANT
SPONGE GAUZE 4X4 12PLY (GAUZE/BANDAGES/DRESSINGS) ×4 IMPLANT
SPONGE LAP 4X18 X RAY DECT (DISPOSABLE) ×4 IMPLANT
STAPLE FIXATION 2.0MM (Staple) ×2 IMPLANT
STEINMANN PINS 2.5 X 100MM ×4 IMPLANT
STOCKINETTE 6  STRL (DRAPES) ×1
STOCKINETTE 6 STRL (DRAPES) ×1 IMPLANT
STRIP CLOSURE SKIN 1/4X4 (GAUZE/BANDAGES/DRESSINGS) ×2 IMPLANT
SUCTION FRAZIER TIP 10 FR DISP (SUCTIONS) ×2 IMPLANT
SUT ETHILON 5 0 PS 2 18 (SUTURE) IMPLANT
SUT MNCRL AB 4-0 PS2 18 (SUTURE) ×2 IMPLANT
SUT VIC AB 3-0 FS2 27 (SUTURE) IMPLANT
SUT VIC AB 3-0 SH 27 (SUTURE) ×1
SUT VIC AB 3-0 SH 27X BRD (SUTURE) ×1 IMPLANT
SUT VICRYL 4-0 PS2 18IN ABS (SUTURE) ×4 IMPLANT
SYR BULB 3OZ (MISCELLANEOUS) ×2 IMPLANT
SYR CONTROL 10ML LL (SYRINGE) ×4 IMPLANT
TAPE STRIPS DRAPE STRL (GAUZE/BANDAGES/DRESSINGS) ×2 IMPLANT
TRAY FOLEY CATH 16FR SILVER (SET/KITS/TRAYS/PACK) ×2 IMPLANT
TUBE CONNECTING 12X1/4 (SUCTIONS) ×2 IMPLANT
UNDERPAD 30X30 INCONTINENT (UNDERPADS AND DIAPERS) ×2 IMPLANT
WATER STERILE IRR 500ML POUR (IV SOLUTION) IMPLANT

## 2013-04-07 NOTE — Anesthesia Preprocedure Evaluation (Addendum)
Anesthesia Evaluation  Patient identified by MRN, date of birth, ID band Patient awake    Reviewed: Allergy & Precautions, H&P , NPO status , Patient's Chart, lab work & pertinent test results  Airway Mallampati: II TM Distance: >3 FB Neck ROM: Full    Dental  (+) Poor Dentition and Loose Upper front 3 teeth are all extremely loose.   Bottom front L also loose  Could easily lose upper teeth with airway placement:   Pulmonary sleep apnea and Continuous Positive Airway Pressure Ventilation , COPD breath sounds clear to auscultation  Pulmonary exam normal       Cardiovascular negative cardio ROS  Rhythm:Regular Rate:Normal     Neuro/Psych End-stage glaucoma   BILATERAL  negative neurological ROS  negative psych ROS   GI/Hepatic negative GI ROS, Neg liver ROS,   Endo/Other  negative endocrine ROS  Renal/GU negative Renal ROS  negative genitourinary   Musculoskeletal negative musculoskeletal ROS (+)   Abdominal   Peds negative pediatric ROS (+)  Hematology negative hematology ROS (+)   Anesthesia Other Findings   Reproductive/Obstetrics negative OB ROS                          Anesthesia Physical Anesthesia Plan  ASA: III  Anesthesia Plan: General   Post-op Pain Management:    Induction: Intravenous  Airway Management Planned: LMA  Additional Equipment:   Intra-op Plan:   Post-operative Plan:   Informed Consent: I have reviewed the patients History and Physical, chart, labs and discussed the procedure including the risks, benefits and alternatives for the proposed anesthesia with the patient or authorized representative who has indicated his/her understanding and acceptance.   Dental advisory given  Plan Discussed with: CRNA and Surgeon  Anesthesia Plan Comments:         Anesthesia Quick Evaluation

## 2013-04-07 NOTE — Anesthesia Procedure Notes (Addendum)
Procedure Name: LMA Insertion Date/Time: 04/07/2013 2:32 PM Performed by: Renella Cunas D Pre-anesthesia Checklist: Patient identified, Emergency Drugs available, Suction available and Patient being monitored Patient Re-evaluated:Patient Re-evaluated prior to inductionOxygen Delivery Method: Circle System Utilized Preoxygenation: Pre-oxygenation with 100% oxygen Intubation Type: IV induction Ventilation: Mask ventilation without difficulty LMA: LMA inserted LMA Size: 5.0 Number of attempts: 1 Airway Equipment and Method: bite block Placement Confirmation: positive ETCO2 Tube secured with: Tape Dental Injury: Teeth and Oropharynx as per pre-operative assessment  Comments: Very loose front upper bridge and lower front teeth before placement of LMA.  Teeth intact after LMA placement.

## 2013-04-07 NOTE — Anesthesia Postprocedure Evaluation (Signed)
  Anesthesia Post-op Note  Patient: ISSAK GOLEY  Procedure(s) Performed: Procedure(s) (LRB): LAPIDUS BUNIONECTOMY/POSSIBLE ALSTON BUNIONECTOMY (Left) AKIN OSTECTOMY (Left) SECOND METATARSAL OSTEOTOMY (Left) PROXIMAL INTERPHALANGEAL FUSION (PIP) SECOND DIGIT (Left)  Patient Location: PACU  Anesthesia Type: General  Level of Consciousness: awake and alert   Airway and Oxygen Therapy: Patient Spontanous Breathing  Post-op Pain: mild  Post-op Assessment: Post-op Vital signs reviewed, Patient's Cardiovascular Status Stable, Respiratory Function Stable, Patent Airway and No signs of Nausea or vomiting  Last Vitals:  Filed Vitals:   04/07/13 1930  BP: 171/114  Pulse: 95  Temp: 37.2 C  Resp:     Post-op Vital Signs: stable   Complications: No apparent anesthesia complications

## 2013-04-07 NOTE — Progress Notes (Signed)
No redness or breakdown to sacrum, dressing applied to sacrum, Allevyn Life sacrum applied.

## 2013-04-07 NOTE — Transfer of Care (Signed)
Immediate Anesthesia Transfer of Care Note  Patient: Derek Martinez  Procedure(s) Performed: Procedure(s) (LRB): LAPIDUS BUNIONECTOMY/POSSIBLE ALSTON BUNIONECTOMY (Left) AKIN OSTECTOMY (Left) SECOND METATARSAL OSTEOTOMY (Left) PROXIMAL INTERPHALANGEAL FUSION (PIP) SECOND DIGIT (Left)  Patient Location: PACU  Anesthesia Type: General  Level of Consciousness: awake, oriented, sedated and patient cooperative  Airway & Oxygen Therapy: Patient Spontanous Breathing and Patient connected to face mask oxygen  Post-op Assessment: Report given to PACU RN and Post -op Vital signs reviewed and stable  Post vital signs: Reviewed and stable  Complications: No apparent anesthesia complications

## 2013-04-15 ENCOUNTER — Encounter (HOSPITAL_BASED_OUTPATIENT_CLINIC_OR_DEPARTMENT_OTHER): Payer: Self-pay | Admitting: Foot & Ankle Surgery

## 2013-04-19 NOTE — Op Note (Addendum)
SURGEON: [place] Lataysha Vohra, D.P.M.  ASSISTANT: Larey Dresser, DPM  PREOPERATIVE DIAGNOSES:  1. Hallux deformity, left foot.  2. Hallux abducto valgus deformity left foot. 3. 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 Hammertoe 2nd digit left foot.  5.   Plantarflexed 2nd metatarsal left foot   POSTOPERATIVE DIAGNOSES:  1. Hallux deformity, left foot.  2. Hallux abducto valgus deformity left foot. 3. 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 Hammertoe 2nd digit left foot.  5.   Plantarflexed 2nd metatarsal left foot   PROCEDURE:  1. Lapidus bunionectomy at the first metatarsal base and medial cuneiform left foot.  2. Austin bunion correction, left foot.  3. Hammertoe repair, correction of hammertoe and K-wire stabilization in second left digit.  4.   Akin osteotomy left foot 5.  2nd metatarsal osteotomy left foot  ANESTHESIA: General.  ESTIMATED BLOOD LOSS: Minimal.  COMPLICATIONS: None.  DESCRIPTION OF PROCEDURE: The patient was brought to the OR and placed  in the supine position at which time general anesthesia was  administered. A local block was performed. A well-padded pneumatic  ankle tourniquet was applied to the medial malleolus. The patient was prepped  and draped in the usual aseptic manner. The foot was exsanguinated with  an Esmarch bandage and the previously applied tourniquet inflated to 250  mmHg.   Attention was directed to the first ray, where a dorsal linear incision  was made. The incision was deepened using sharp and blunt modalities,  taking care to clamp and cauterize all bleeding vessels and gentle  traction of neurovascular structures were encountered. The deep and  superficial fascia were separated medially and dorsally along the incision. Attention was directed to the first metatarsal head region  where an inverted-L capsulotomy was performed. The capsule and  periosteum were freed  from the first metatarsal. The medial eminence  was resected parallel with the shaft.  Attention was then directed at the first interspace where dissection was  carried to the level of the fibular sesamoid. Once identified the  fibular sesamoid was excised and passed off the surgical field. The surgical wound  was irrigated with copious amounts of sterile saline and antibiotic  solution. Attention was redirected at the level of the tarsometatarsal  joint and incision was extended approximately 2 cm proximal. Incision  was deepened through the fascial layers through the dorsal capsule and  tarsometatarsal joint using blunt dissection  and tendon  retracted laterally. Location of the joint was identified directly. A  capsulotomy was performed at the superior aspect to expose the entire  joint. Care was taken to protect all neurovascular structures  throughout the entire case. Care was also taken to ensure complete  exposure of the joint with lateral aspects of these joints. Once I  identified the joint, preparation was performed and a power saw was used  to remove the cartilage of the medial cuneiform in the base of the first  metatarsal taking care to ensure all eminences of the bone were removed.  Osteotome and mallet were also used to carefully remove the section of  bone and cartilage. Both the base of the first metatarsal and medial cuneiform were fenestrated using a 0.045 K-wire. The area was irrigated  with copious amounts of sterile saline and antibiotic solution. With  the use of intraoperative radiograph, the reduction of the  intermetatarsal angle was accomplished and a temporary 0.062 K-wire was  used to hold the reduction. Next,  once proper and adequate fixation was used once  satisfactory reduction of intermetatarsal angle was accomplished. These  are nitinol staple, one done dorsally and one done medially. The  appropriate size was premeasured and these were found to be  excellent  fixation. Confirmation of placement of the staples were confirmed using  intraoperative radiograph. The area was then irrigated with copious  amount of sterile saline and antibiotic solution. Deep closure in this  area was accomplished using 3-0 and 4-0 Vicryl. Next, the attention was  directed back at the first metatarsal where another procedure was  performed.   At this time, decision was made to do a first metatarsal head osteotomy. This 60 degree osteotomy was conducting taking care to protect the sesamoid apparatus. The osteotomy was then stabilized and fixated using a  3.0 cannulated screw. The remaining medial eminences were resected  parallel to the shaft and all rough edges smoothed with a hand rasp.  Medial capsulorrhaphy was performed and deep closure accomplished with 3-  0 and 4-0 Vicryl.  Attention was directed to the hallux where the incision was extended to  the level of the proximal phalanx and the periosteum and capsule were  reflected. Once exposed, an Akin osteotomy was performed. Taking care  to leave the lateral cortex intact. The wedge of bone was removed and  the area was reduced and fixated using a 8 mm Nitinol staple. Again,  placement of the staple was confirmed with intraoperative radiograph.  The area was irrigated with copious amounts of sterile saline and  antibiotic solution. Deep closure was accomplished with 3-0 and 4-0  Vicryl and skin closure was accomplished in the entire incision using 4-  0 Monocryl. Once the tourniquet had been inflated after 1 hour and 59  minutes, it was deflated and 20 minutes was passed before reinflating  the tourniquet.   2ND DIGIT HAMMERTOE AND METATARSAL OSTEOTOMY: ATTENTION WAS DIRECTED TO THE SECOND DIGIT OF THE left FOOT, WHERE A   linear SKIN INCISION WAS MADE OVER THE DORSAL ASPECT OF THE PROXIMAL   INTERPHALANGEAL JOINT AND THE SKIN WEDGE WAS PASSED FROM OPERATIVE FIELD. THE   INCISION WAS DEEPENED  VIA SHARP AND BLUNT DISSECTION DOWN TO THE JOINT CAPSULE.   ALL NEUROVASCULAR STRUCTURES ENCOUNTERED WERE PRESERVED AND ALL BLEEDERS WERE   CLAMPED AND COAGULATED. THE INCISION WAS DEEPENED DOWN TO THE TENDON AND A   TRANSVERSE TENOTOMY WAS PERFORMED. ALL LIGAMENTOUS ATTACHMENTS TO THE HEAD OF THE   PROXIMAL PHALANX AND BASE OF THE MIDDLE PHALANX WERE EXCISED. THE HEAD OF THE   PROXIMAL PHALANX WAS PRESENTED DORSALLY THROUGH THE WOUND, WAS EXCISED USING THE   saw AND PASSED FROM OPERATIVE FIELD. AT THIS TIME, THE   SKIN INCISION WAS EXTENDED 3 CM PROXIMALLY AND WAS MADE MEDIAL AND PARALLEL TO   THE EXTENSOR DIGITORUM TENDON ENCOMPASSING THE SECOND METATARSOPHALANGEAL JOINT.   THE INCISION WAS DEEPENED VIA SHARP AND BLUNT DISSECTION DOWN TO JOINT CAPSULE.   ALL NEUROVASCULAR STRUCTURES ENCOUNTERED WERE PRESERVED AND ALL BLEEDERS WERE   CLAMPED AND COAGULATED. AT THIS TIME, A DORSAL LINEAR INCISION WAS PERFORMED ON   THE JOINT CAPSULE DOWN TO BONE. THE CAPSULE WAS THEN CAREFULLY REFLECTED OFF THE   UNDERLYING METATARSAL HEAD. NEXT, BY UTILIZING A SAGITTAL SAW A THROUGH AND   THROUGH OSTEOTOMY CUT FROM DORSO PROXIMAL TO PLANTAR DISTAL WAS CREATED IN THE   METAPHYSEAL REGION OF THE BONE WITH THE APEX OF THE OSTEOTOMY POINTED DISTALLY.   UPON COMPLETION OF  THE OSTEOTOMY, THE CAPITAL FRAGMENT WAS DISTRACTED AND SHIFTED   DORSALLY INTO A MORE CORRECTED POSITION TO REDUCE THE DEFORMITY. ONCE THE   CORRECTED POSITION WAS ACHIEVED, THE CAPITAL FRAGMENT WAS IMPACTED UPON THE   SECOND METATARSAL SHAFT. THE OSTEOTOMY WAS HELD IN PLACE WITH A FENESTRATED 0.062   K WIRE FOLLOWED FIXATION WITH 0.6 K WIRE BECAUSE THE BONE WAS SOFT.  NEXT 0.45 K WIRE WAS PLACED FOR FIXATION AT THE END OF SECOND TOE THROUGH SECOND METATARSAL HEAD IN ORDER TO HOLD THE DIGIT IN   PROPER ALIGNMENT. NEXT, COPIOUS LAVAGE WAS FLUSHED TO INCISION SITE WITH   STERILE NORMAL SALINE. THE CAPSULE WAS COAPTED WITH 2.0  VICRYL VIA SIMPLE SUTURE   INTERRUPTED TECHNIQUE. THE EXTENSOR TENDON WAS REAPPROXIMATED WITH 3.0 VICRYL VIA   SIMPLE SUTURE INTERRUPTED FASHION. DEEP CLOSURE WAS ACHIEVED WITH 3.0 VICRYL VIA   SUBCUTANEOUS INTERRUPTED FASHION. SKIN LAYERS WERE RE-APPROXIMATED WITH NYLON.   All the surgical sites were then dressed with Steri-Strips, Xeroform, 4 x 4s, Kling, and Coban. The  tourniquet was deflated and vascular status returned to all digits. The  patient was sent to the recovery room with vital signs stable and  capillary refill time to pre-surgical levels. Both written and oral  postop instructions were given to the patient. He remain  completely non weightbearing. All questions were answered and no  guarantees were given. The patient was instructed to contact office  immediately if there are any problems with postoperative care.

## 2013-04-20 ENCOUNTER — Other Ambulatory Visit: Payer: Self-pay | Admitting: Foot & Ankle Surgery

## 2013-05-03 DIAGNOSIS — M67919 Unspecified disorder of synovium and tendon, unspecified shoulder: Secondary | ICD-10-CM | POA: Diagnosis not present

## 2013-05-04 DIAGNOSIS — H4010X0 Unspecified open-angle glaucoma, stage unspecified: Secondary | ICD-10-CM | POA: Diagnosis not present

## 2013-05-04 DIAGNOSIS — H409 Unspecified glaucoma: Secondary | ICD-10-CM | POA: Diagnosis not present

## 2013-12-20 DIAGNOSIS — H44439 Hypotony of eye due to other ocular disorders, unspecified eye: Secondary | ICD-10-CM | POA: Diagnosis not present

## 2013-12-20 DIAGNOSIS — H1189 Other specified disorders of conjunctiva: Secondary | ICD-10-CM | POA: Diagnosis not present

## 2013-12-20 DIAGNOSIS — H2510 Age-related nuclear cataract, unspecified eye: Secondary | ICD-10-CM | POA: Diagnosis not present

## 2013-12-20 DIAGNOSIS — Z961 Presence of intraocular lens: Secondary | ICD-10-CM | POA: Diagnosis not present

## 2013-12-20 DIAGNOSIS — H4440 Unspecified hypotony of eye: Secondary | ICD-10-CM | POA: Diagnosis not present

## 2013-12-20 DIAGNOSIS — H4011X0 Primary open-angle glaucoma, stage unspecified: Secondary | ICD-10-CM | POA: Diagnosis not present

## 2013-12-22 DIAGNOSIS — H4011X0 Primary open-angle glaucoma, stage unspecified: Secondary | ICD-10-CM | POA: Diagnosis not present

## 2013-12-22 DIAGNOSIS — H44429 Hypotony of unspecified eye due to ocular fistula: Secondary | ICD-10-CM | POA: Diagnosis not present

## 2013-12-27 DIAGNOSIS — Z961 Presence of intraocular lens: Secondary | ICD-10-CM | POA: Diagnosis not present

## 2013-12-27 DIAGNOSIS — H2510 Age-related nuclear cataract, unspecified eye: Secondary | ICD-10-CM | POA: Diagnosis not present

## 2013-12-27 DIAGNOSIS — H4011X0 Primary open-angle glaucoma, stage unspecified: Secondary | ICD-10-CM | POA: Diagnosis not present

## 2013-12-31 DIAGNOSIS — H2510 Age-related nuclear cataract, unspecified eye: Secondary | ICD-10-CM | POA: Diagnosis not present

## 2013-12-31 DIAGNOSIS — H44429 Hypotony of unspecified eye due to ocular fistula: Secondary | ICD-10-CM | POA: Diagnosis not present

## 2013-12-31 DIAGNOSIS — Z961 Presence of intraocular lens: Secondary | ICD-10-CM | POA: Diagnosis not present

## 2013-12-31 DIAGNOSIS — H4011X0 Primary open-angle glaucoma, stage unspecified: Secondary | ICD-10-CM | POA: Diagnosis not present

## 2014-01-06 DIAGNOSIS — H409 Unspecified glaucoma: Secondary | ICD-10-CM | POA: Diagnosis not present

## 2014-01-06 DIAGNOSIS — H4010X0 Unspecified open-angle glaucoma, stage unspecified: Secondary | ICD-10-CM | POA: Diagnosis not present

## 2014-01-06 DIAGNOSIS — H2510 Age-related nuclear cataract, unspecified eye: Secondary | ICD-10-CM | POA: Diagnosis not present

## 2014-02-02 DIAGNOSIS — H25012 Cortical age-related cataract, left eye: Secondary | ICD-10-CM | POA: Diagnosis not present

## 2014-02-02 DIAGNOSIS — Z9883 Filtering (vitreous) bleb after glaucoma surgery status: Secondary | ICD-10-CM | POA: Diagnosis not present

## 2014-02-02 DIAGNOSIS — H4440 Unspecified hypotony of eye: Secondary | ICD-10-CM | POA: Diagnosis not present

## 2014-02-02 DIAGNOSIS — H4011X3 Primary open-angle glaucoma, severe stage: Secondary | ICD-10-CM | POA: Diagnosis not present

## 2014-02-02 DIAGNOSIS — H2512 Age-related nuclear cataract, left eye: Secondary | ICD-10-CM | POA: Diagnosis not present

## 2014-02-17 ENCOUNTER — Telehealth: Payer: Self-pay | Admitting: Pulmonary Disease

## 2014-02-17 MED ORDER — ALBUTEROL SULFATE (2.5 MG/3ML) 0.083% IN NEBU: 2.5000 mg | INHALATION_SOLUTION | Freq: Four times a day (QID) | RESPIRATORY_TRACT | Status: DC | PRN

## 2014-02-17 NOTE — Telephone Encounter (Signed)
Called and spoke with pt and he needs a letter from [facility] to state that he is ok to give blood.  He stated that he had told them that he has asthma and they did hear some wheezing and are asking that the pt bring in a letter from [facility].  Pt is faxing over a form that will need to be filled out by [facility].  Will forward this message to Ashtyn to follow up on fax.

## 2014-02-21 NOTE — Telephone Encounter (Signed)
Spoke with Derek Martinez, she has not yet received fax. ATC pt. Unable to leave VM.  WCB to get pt to re-fax form.

## 2014-02-22 NOTE — Telephone Encounter (Signed)
ATC patient-"the caller you are trying to reach is not taking calls at this time" message was given and told to try again later.

## 2014-02-23 NOTE — Telephone Encounter (Signed)
ATC pt- automated message stating that the "person trying to reach is not available at this time"  wcb

## 2014-02-24 NOTE — Telephone Encounter (Signed)
ATC pt-"person you are trying to reach is not available at this time." WCB before weekend before closing message.

## 2014-02-25 NOTE — Telephone Encounter (Signed)
ATC patient-"the caller you are trying to reach is not taking calls at this time" message was given and told to try again later.

## 2014-02-28 NOTE — Telephone Encounter (Signed)
attemtped to call the pt and received the same message as below.  Will sign off of this message at this time.

## 2014-03-17 DIAGNOSIS — N529 Male erectile dysfunction, unspecified: Secondary | ICD-10-CM | POA: Diagnosis not present

## 2014-03-17 DIAGNOSIS — N5231 Erectile dysfunction following radical prostatectomy: Secondary | ICD-10-CM | POA: Diagnosis not present

## 2014-03-17 DIAGNOSIS — C61 Malignant neoplasm of prostate: Secondary | ICD-10-CM | POA: Diagnosis not present

## 2014-04-12 DIAGNOSIS — C61 Malignant neoplasm of prostate: Secondary | ICD-10-CM | POA: Diagnosis not present

## 2014-04-12 DIAGNOSIS — N5231 Erectile dysfunction following radical prostatectomy: Secondary | ICD-10-CM | POA: Diagnosis not present

## 2014-04-25 DIAGNOSIS — N5231 Erectile dysfunction following radical prostatectomy: Secondary | ICD-10-CM | POA: Diagnosis not present

## 2014-04-25 DIAGNOSIS — Z8546 Personal history of malignant neoplasm of prostate: Secondary | ICD-10-CM | POA: Diagnosis not present

## 2014-04-25 DIAGNOSIS — G4733 Obstructive sleep apnea (adult) (pediatric): Secondary | ICD-10-CM | POA: Diagnosis not present

## 2014-04-25 DIAGNOSIS — H409 Unspecified glaucoma: Secondary | ICD-10-CM | POA: Diagnosis not present

## 2014-04-25 DIAGNOSIS — J45909 Unspecified asthma, uncomplicated: Secondary | ICD-10-CM | POA: Diagnosis not present

## 2014-04-25 DIAGNOSIS — N529 Male erectile dysfunction, unspecified: Secondary | ICD-10-CM | POA: Diagnosis not present

## 2014-04-25 DIAGNOSIS — N486 Induration penis plastica: Secondary | ICD-10-CM | POA: Diagnosis not present

## 2014-04-25 DIAGNOSIS — Z9079 Acquired absence of other genital organ(s): Secondary | ICD-10-CM | POA: Diagnosis not present

## 2014-04-26 DIAGNOSIS — J45909 Unspecified asthma, uncomplicated: Secondary | ICD-10-CM | POA: Diagnosis not present

## 2014-04-26 DIAGNOSIS — N486 Induration penis plastica: Secondary | ICD-10-CM | POA: Diagnosis not present

## 2014-04-26 DIAGNOSIS — H409 Unspecified glaucoma: Secondary | ICD-10-CM | POA: Diagnosis not present

## 2014-04-26 DIAGNOSIS — G4733 Obstructive sleep apnea (adult) (pediatric): Secondary | ICD-10-CM | POA: Diagnosis not present

## 2014-04-26 DIAGNOSIS — N529 Male erectile dysfunction, unspecified: Secondary | ICD-10-CM | POA: Diagnosis not present

## 2014-04-26 DIAGNOSIS — Z8546 Personal history of malignant neoplasm of prostate: Secondary | ICD-10-CM | POA: Diagnosis not present

## 2014-06-24 DIAGNOSIS — H4010X3 Unspecified open-angle glaucoma, severe stage: Secondary | ICD-10-CM | POA: Diagnosis not present

## 2014-07-26 DIAGNOSIS — H4010X3 Unspecified open-angle glaucoma, severe stage: Secondary | ICD-10-CM | POA: Diagnosis not present

## 2014-10-10 DIAGNOSIS — J209 Acute bronchitis, unspecified: Secondary | ICD-10-CM | POA: Diagnosis not present

## 2014-11-22 DIAGNOSIS — H4010X3 Unspecified open-angle glaucoma, severe stage: Secondary | ICD-10-CM | POA: Diagnosis not present

## 2015-02-13 DIAGNOSIS — R069 Unspecified abnormalities of breathing: Secondary | ICD-10-CM | POA: Diagnosis not present

## 2015-03-10 ENCOUNTER — Encounter: Payer: Self-pay | Admitting: Internal Medicine

## 2015-03-10 ENCOUNTER — Ambulatory Visit (INDEPENDENT_AMBULATORY_CARE_PROVIDER_SITE_OTHER): Payer: Medicare Other | Admitting: Internal Medicine

## 2015-03-10 ENCOUNTER — Telehealth: Payer: Self-pay | Admitting: Pulmonary Disease

## 2015-03-10 VITALS — BP 130/82 | HR 75 | Ht 64.0 in | Wt 167.2 lb

## 2015-03-10 DIAGNOSIS — J4551 Severe persistent asthma with (acute) exacerbation: Secondary | ICD-10-CM | POA: Diagnosis not present

## 2015-03-10 MED ORDER — MOMETASONE FURO-FORMOTEROL FUM 100-5 MCG/ACT IN AERO: INHALATION_SPRAY | RESPIRATORY_TRACT | Status: DC

## 2015-03-10 MED ORDER — MOMETASONE FURO-FORMOTEROL FUM 200-5 MCG/ACT IN AERO: INHALATION_SPRAY | RESPIRATORY_TRACT | Status: DC

## 2015-03-10 MED ORDER — PREDNISONE 10 MG PO TABS: ORAL_TABLET | ORAL | Status: DC

## 2015-03-10 MED ORDER — ALBUTEROL SULFATE HFA 108 (90 BASE) MCG/ACT IN AERS: INHALATION_SPRAY | RESPIRATORY_TRACT | Status: DC

## 2015-03-10 NOTE — Progress Notes (Signed)
Subjective:     Patient ID: EON BRAKE, male   DOB: 01/20/1956,     MRN: [id number]  HPI  32 ybm never smoker asthma all his life but has periods up to several months of no symptoms and no need for inhalers but more severe sob and on daily meds x late Sept 2016 just consisting of saba and no more advair self referred back to pulmonary clinic p last ov with Dr Halford Chessman 03/2012    03/10/2015 acute extended ov/Kyley Solow re: sob/ Chief Complaint  Patient presents with  . Acute Visit    [facility] pt. C/o increase SOB and wheezing w/ exertion, no cough.  really unclear what he has at home as his ventolin is empty and not using advair at all but sob between nebs up to 4-6 x daily  Sob with any activity but confortable at rest assoc with gen chest tightness nothing localized.  No obvious day to day or daytime variability or assoc chronic cough or cp or   or overt sinus or hb symptoms. No unusual exp hx or h/o childhood pna/ asthma or knowledge of premature birth.  Also denies any obvious fluctuation of symptoms with weather or environmental changes or other aggravating or alleviating factors except as outlined above   Current Medications, Allergies, Complete Past Medical History, Past Surgical History, Family History, and Social History were reviewed in [employer] record.  ROS  The following are not active complaints unless bolded sore throat, dysphagia, dental problems, itching, sneezing,  nasal congestion or excess/ purulent secretions, ear ache,   fever, chills, sweats, unintended wt loss, classically pleuritic or exertional cp, hemoptysis,  orthopnea pnd or leg swelling, presyncope, palpitations, abdominal pain, anorexia, nausea, vomiting, diarrhea  or change in bowel or bladder habits, change in stools or urine, dysuria,hematuria,  rash, arthralgias, visual complaints, headache, numbness, weakness or ataxia or problems with walking or coordination,  change in mood/affect or memory.            Review of Systems     Objective:   Physical Exam amb bm with audible wheeze heard from 10 ft away both insp and exp  Wt Readings from Last 3 Encounters:  03/10/15 167 lb 3.2 oz (75.841 kg)  04/07/13 163 lb (73.936 kg)  04/16/12 174 lb 6.4 oz (79.107 kg)    Vital signs reviewed   HEENT: nl dentition, turbinates, and oropharynx. Nl external ear canals without cough reflex   NECK :  without JVD/Nodes/TM/ nl carotid upstrokes bilaterally   LUNGS:  insp and exp sonorous rhonchi and wheezes bilaterally/ better p dulera    CV:  RRR  no s3 or murmur or increase in P2, no edema   ABD:  soft and nontender with nl excursion in the supine position. No bruits or organomegaly, bowel sounds nl  MS:  warm without deformities, calf tenderness, cyanosis or clubbing  SKIN: warm and dry without lesions    NEURO:  alert, approp, no deficits        Assessment:

## 2015-03-10 NOTE — Telephone Encounter (Signed)
Pt has been scheduled to see MW today at 4:15pm. Nothing further was needed.

## 2015-03-10 NOTE — Patient Instructions (Signed)
Plan A = Automatic = Dulera 200 Take 2 puffs first thing in am and then another 2 puffs about 12 hours later.   Plan B = Backup Only use your albuterol (proair) as a rescue medication to be used if you can't catch your breath by resting or doing a relaxed purse lip breathing pattern.  - The less you use it, the better it will work when you need it. - Ok to use up to 2 puffs  every 4 hours if you must but call for immediate appointment if use goes up over your usual need - Don't leave home without it !!  (think of it like the spare tire for your car)   Plan C= Crisis - only use Nebulizer if you try proair/albuterol 1st and it doesn't work- ok to use up to every 4 hours   Plan D = Doctor,  Call us  if nebulizer is not relieving your symptoms or need goes up   Plan E = go to ER if all else fails  Prednisone 10 mg take  4 each am x 2 days,   2 each am x 2 days,  1 each am x 2 days and stop   Please schedule a follow up office visit in 2 weeks, sooner if needed

## 2015-03-11 DIAGNOSIS — J45901 Unspecified asthma with (acute) exacerbation: Secondary | ICD-10-CM | POA: Insufficient documentation

## 2015-03-11 NOTE — Assessment & Plan Note (Signed)
Extremely poor control chronically with overuse of saba now having a typical flare DDX of  difficult airways management all start with A and  include Adherence, Ace Inhibitors, Acid Reflux, Active Sinus Disease, Alpha 1 Antitripsin deficiency, Anxiety masquerading as Airways dz,  ABPA,  allergy(esp in young), Aspiration (esp in elderly), Adverse effects of meds,  Active smokers, A bunch of PE's (a small clot burden can't cause this syndrome unless there is already severe underlying pulm or vascular dz with poor reserve) plus two Bs  = Bronchiectasis and Beta blocker use..and one C= CHF  In this case Adherence is the biggest issue and starts with  inability to use HFA effectively and also  understand that SABA treats the symptoms but doesn't get to the underlying problem (inflammation).  I used  the analogy of putting steroid cream on a rash to help explain the meaning of topical therapy and the need to get the drug to the target tissue.   - The proper method of use, as well as anticipated side effects, of a metered-dose inhaler are discussed and demonstrated to the patient. Improved effectiveness after extensive coaching during this visit to a level of approximately  75%   ? Adverse effects of dpi > change advair to dulera hfa  ? Allergy mech > Prednisone 10 mg take  4 each am x 2 days,   2 each am x 2 days,  1 each am x 2 days and stop   I had an extended discussion with the patient reviewing all relevant studies completed to date and  lasting 25 minutes of a 40  minute acute extended visit    Offered neb but pt stated felt better p using dulera 200 appropriately, informed if no resp to saba neb > ER (see avs)   Each maintenance medication was reviewed in detail including most importantly the difference between maintenance and prns and under what circumstances the prns are to be triggered using an action plan format that is not reflected in the computer generated alphabetically organized AVS.     Please see instructions for details which were reviewed in writing and the patient given a copy highlighting the part that I personally wrote and discussed at today's ov.

## 2015-03-29 ENCOUNTER — Ambulatory Visit: Payer: Medicare Other | Admitting: Adult Health

## 2015-04-11 ENCOUNTER — Telehealth: Payer: Self-pay | Admitting: Internal Medicine

## 2015-04-11 DIAGNOSIS — Z23 Encounter for immunization: Secondary | ICD-10-CM | POA: Diagnosis not present

## 2015-04-11 MED ORDER — MOMETASONE FURO-FORMOTEROL FUM 200-5 MCG/ACT IN AERO: INHALATION_SPRAY | RESPIRATORY_TRACT | Status: DC

## 2015-04-11 NOTE — Telephone Encounter (Signed)
Called spoke with pt. He is at the pharmacy wanting dulera sent in. I have done so. Nothing further needed

## 2015-05-02 ENCOUNTER — Other Ambulatory Visit: Payer: Self-pay

## 2015-05-02 MED ORDER — ALBUTEROL SULFATE (2.5 MG/3ML) 0.083% IN NEBU: 2.5000 mg | INHALATION_SOLUTION | Freq: Four times a day (QID) | RESPIRATORY_TRACT | Status: DC | PRN

## 2015-06-13 DIAGNOSIS — H4010X3 Unspecified open-angle glaucoma, severe stage: Secondary | ICD-10-CM | POA: Diagnosis not present

## 2015-06-29 ENCOUNTER — Other Ambulatory Visit: Payer: Self-pay | Admitting: Internal Medicine

## 2015-06-29 DIAGNOSIS — R300 Dysuria: Secondary | ICD-10-CM | POA: Diagnosis not present

## 2015-06-29 DIAGNOSIS — N5231 Erectile dysfunction following radical prostatectomy: Secondary | ICD-10-CM | POA: Diagnosis not present

## 2015-06-29 DIAGNOSIS — Z8546 Personal history of malignant neoplasm of prostate: Secondary | ICD-10-CM | POA: Diagnosis not present

## 2015-07-04 ENCOUNTER — Telehealth: Payer: Self-pay | Admitting: Internal Medicine

## 2015-07-04 MED ORDER — MOMETASONE FURO-FORMOTEROL FUM 200-5 MCG/ACT IN AERO: INHALATION_SPRAY | RESPIRATORY_TRACT | Status: DC

## 2015-07-04 NOTE — Telephone Encounter (Signed)
Pt requesting refill on dulera.  This has been sent.  Nothing further needed.

## 2015-07-07 ENCOUNTER — Telehealth: Payer: Self-pay | Admitting: Internal Medicine

## 2015-07-07 ENCOUNTER — Encounter: Payer: Self-pay | Admitting: Internal Medicine

## 2015-07-07 ENCOUNTER — Ambulatory Visit (INDEPENDENT_AMBULATORY_CARE_PROVIDER_SITE_OTHER): Payer: PPO | Admitting: Internal Medicine

## 2015-07-07 VITALS — BP 120/80 | HR 67 | Ht 67.0 in | Wt 162.0 lb

## 2015-07-07 DIAGNOSIS — J45901 Unspecified asthma with (acute) exacerbation: Secondary | ICD-10-CM | POA: Diagnosis not present

## 2015-07-07 DIAGNOSIS — G4733 Obstructive sleep apnea (adult) (pediatric): Secondary | ICD-10-CM

## 2015-07-07 MED ORDER — BUDESONIDE-FORMOTEROL FUMARATE 160-4.5 MCG/ACT IN AERO: INHALATION_SPRAY | RESPIRATORY_TRACT | Status: DC

## 2015-07-07 NOTE — Patient Instructions (Signed)
Plan A = Automatic = symbicort 160  Take 2 puffs first thing in am and then another 2 puffs about 12 hours later.   Plan B = Backup Only use your albuterol (proair) as a rescue medication to be used if you can't catch your breath by resting or doing a relaxed purse lip breathing pattern.  - The less you use it, the better it will work when you need it. - Ok to use up to 2 puffs  every 4 hours if you must but call for immediate appointment if use goes up over your usual need - Don't leave home without it !!  (think of it like the spare tire for your car)   Plan C= Crisis - only use Nebulizer if you try proair/albuterol 1st and it doesn't work- ok to use up to every 4 hours   Plan D = Doctor,  Call us  if nebulizer is not relieving your symptoms or need goes up   Plan E = go to ER if all else fails  Make appt to see Dr Halford Chessman to follow up on your sleep apnea next available   Return to this clinic as needed

## 2015-07-07 NOTE — Telephone Encounter (Signed)
Called # provided and received message to leave a VM for call back as all reps are NA LMTCB

## 2015-07-07 NOTE — Progress Notes (Signed)
Subjective:    Patient ID: Derek Martinez, male   DOB: 1955/10/07      MRN: [id number]     Brief patient profile:  29 ybm never smoker asthma all his life but has periods up to several months of no symptoms and no need for inhalers but more severe sob and on daily meds x late Sept 2016 just consisting of saba and no more advair self referred back to pulmonary clinic p last ov with Dr Halford Chessman 03/2012   History of Present Illness  03/10/2015 acute extended ov/Derek Martinez re: sob/ Chief Complaint  Patient presents with  . Acute Visit    VS pt. C/o increase SOB and wheezing w/ exertion, no cough.  really unclear what he has at home as his ventolin is empty and not using advair at all but sob between nebs up to 4-6 x daily  Sob with any activity but confortable at rest assoc with gen chest tightness nothing localized. rec Plan A = Automatic = Dulera 200 Take 2 puffs first thing in am and then another 2 puffs about 12 hours later.  Plan B = Backup Only use your albuterol (proair) as a rescue medication  Plan C= Crisis - only use Nebulizer if you try proair/albuterol 1st and it doesn't work- ok to use up to every 4 hours    07/07/2015  f/u ov/Derek Martinez re: chronic asthma/ off dulera 200 x one week  Chief Complaint  Patient presents with  . Follow-up    Breathing has worse since he ran out of Dulera 1 little over 1 wk ago. Insurance does not prefer [employer]. He has been having to use his albuterol inhaler and neb daily.    on dulera no need at all for saba then gradually needed more p ran out    No obvious day to day or daytime variability or assoc chronic cough or cp or   or overt sinus or hb symptoms. No unusual exp hx or h/o childhood pna/ asthma or knowledge of premature birth.  Also denies any obvious fluctuation of symptoms with weather or environmental changes or other aggravating or alleviating factors except as outlined above   Current Medications, Allergies, Complete Past Medical History, Past  Surgical History, Family History, and Social History were reviewed in [employer] record.  ROS  The following are not active complaints unless bolded sore throat, dysphagia, dental problems, itching, sneezing,  nasal congestion or excess/ purulent secretions, ear ache,   fever, chills, sweats, unintended wt loss, classically pleuritic or exertional cp, hemoptysis,  orthopnea pnd or leg swelling, presyncope, palpitations, abdominal pain, anorexia, nausea, vomiting, diarrhea  or change in bowel or bladder habits, change in stools or urine, dysuria,hematuria,  rash, arthralgias, visual complaints, headache, numbness, weakness or ataxia or problems with walking or coordination,  change in mood/affect or memory.                Objective:   Physical Exam  amb bm  Last  saba 3 h prior to OV    07/07/2015          03/10/15 167 lb 3.2 oz (75.841 kg)  04/07/13 163 lb (73.936 kg)  04/16/12 174 lb 6.4 oz (79.107 kg)    Vital signs reviewed   HEENT: nl dentition, turbinates, and oropharynx. Nl external ear canals without cough reflex   NECK :  without JVD/Nodes/TM/ nl carotid upstrokes bilaterally   LUNGS: min insp and exp sonorous rhonchi and wheezes bilaterally  CV:  RRR  no s3 or murmur or increase in P2, no edema   ABD:  soft and nontender with nl excursion in the supine position. No bruits or organomegaly, bowel sounds nl  MS:  warm without deformities, calf tenderness, cyanosis or clubbing  SKIN: warm and dry without lesions    NEURO:  alert, approp, no deficits        Assessment:

## 2015-07-09 NOTE — Assessment & Plan Note (Signed)
Referred back to Dr Halford Chessman

## 2015-07-09 NOTE — Assessment & Plan Note (Signed)
03/10/2015   try dulera 200 2bid > improved  - 07/07/2015  extensive coaching HFA effectiveness =    90% > changed to symbicort 160 2bid due to insurance denying dulera   I had an extended final summary discussion with the patient reviewing all relevant studies completed to date and  lasting 15 to 20 minutes of a 25 minute visit on the following issues:    1) Formulary restrictions will be an ongoing challenge for the forseable future and I would be happy to pick an alternative if the pt will first  provide me a list of them but pt  will need to return here for training for any new device that is required eg dpi vs hfa vs respimat.    In meantime we can always provide samples so the patient never runs out of any needed respiratory medications.   2) should be able to get symbicort thru insurance and continue his hfa saba prn as well with back up neb  3) has f/u with Dr Halford Chessman for sleep/ can see also for refills on asthma rx or have this done by primary care as long as doing well on symbicort  4) Each maintenance medication was reviewed in detail including most importantly the difference between maintenance and as needed and under what circumstances the prns are to be used.  Please see instructions for details which were reviewed in writing and the patient given a copy.

## 2015-07-10 NOTE — Telephone Encounter (Signed)
Called # provided but received same message below and advised to leave detailed message w/ reference #, pt name LMTCB x2

## 2015-07-11 NOTE — Telephone Encounter (Signed)
lmtcb x3 

## 2015-07-12 ENCOUNTER — Telehealth: Payer: Self-pay | Admitting: Internal Medicine

## 2015-07-12 NOTE — Telephone Encounter (Signed)
LMTCB and will close per triage protocol 

## 2015-07-12 NOTE — Telephone Encounter (Signed)
error 

## 2015-08-14 DIAGNOSIS — J309 Allergic rhinitis, unspecified: Secondary | ICD-10-CM | POA: Diagnosis not present

## 2015-08-14 DIAGNOSIS — G4733 Obstructive sleep apnea (adult) (pediatric): Secondary | ICD-10-CM | POA: Diagnosis not present

## 2015-08-14 DIAGNOSIS — J45909 Unspecified asthma, uncomplicated: Secondary | ICD-10-CM | POA: Diagnosis not present

## 2015-10-14 ENCOUNTER — Encounter (HOSPITAL_COMMUNITY): Payer: Self-pay | Admitting: *Deleted

## 2015-10-14 ENCOUNTER — Ambulatory Visit (HOSPITAL_COMMUNITY)
Admission: EM | Admit: 2015-10-14 | Discharge: 2015-10-14 | Disposition: A | Discharge status: Home / Self Care | Payer: PPO | Attending: Family Medicine | Admitting: Family Medicine

## 2015-10-14 DIAGNOSIS — K0889 Other specified disorders of teeth and supporting structures: Secondary | ICD-10-CM | POA: Diagnosis not present

## 2015-10-14 MED ORDER — DICLOFENAC POTASSIUM 50 MG PO TABS: 50.0000 mg | ORAL_TABLET | Freq: Three times a day (TID) | ORAL | Status: DC

## 2015-10-14 MED ORDER — CLINDAMYCIN HCL 300 MG PO CAPS: 300.0000 mg | ORAL_CAPSULE | Freq: Three times a day (TID) | ORAL | Status: DC

## 2015-10-14 NOTE — ED Provider Notes (Signed)
CSN: [id number]     Arrival date & time 10/14/15  1355 History   First MD Initiated Contact with Patient 10/14/15 1550     Chief Complaint  Patient presents with  . Dental Pain   (Consider location/radiation/quality/duration/timing/severity/associated sxs/prior Treatment) Patient is a 60 y.o. male presenting with tooth pain. The history is provided by the patient.  Dental Pain Location:  Lower Lower teeth location:  32/RL 3rd molar Quality:  Throbbing Severity:  Moderate Onset quality:  Gradual Duration:  4 days Chronicity:  New Context: poor dentition   Context comment:  Loose tooth   Past Medical History  Diagnosis Date  . Allergic rhinitis   . OSA on CPAP     MILD-- PER LAST SLEEP STUDY 10-15-2011   . COPD (chronic obstructive pulmonary disease) (HCC)     PULMOLOGIST-- DR SOOD  . ED (erectile dysfunction)   . Legally blind     BILATERAL  . End-stage glaucoma     BILATERAL  . History of prostate cancer     S/P PROSTATECTOMY  04-14-2012  . Arthritis     RIGHT SHOULDER  . History of dislocation of shoulder     RIGHT 2008  . Wears glasses   . Allergic asthma    Past Surgical History  Procedure Laterality Date  . Glaucoma surgery Bilateral     x 5  (INCLUDING FILTERING BLEB)  . Prostatectomy  04-14-2012  [facility]  . Bunionectomy Left 04/07/2013    Procedure: LAPIDUS BUNIONECTOMY/POSSIBLE ALSTON BUNIONECTOMY;  Surgeon: Francee Piccolo, MD;  Location: [place];  Service: Podiatry;  Laterality: Left;  . Ostectomy Left 04/07/2013    Procedure: AKIN OSTECTOMY;  Surgeon: Francee Piccolo, MD;  Location: [place];  Service: Podiatry;  Laterality: Left;  Marland Kitchen Metatarsal osteotomy Left 04/07/2013    Procedure: SECOND METATARSAL OSTEOTOMY;  Surgeon: Francee Piccolo, MD;  Location: [place];  Service: Podiatry;  Laterality: Left;  . Proximal interphalangeal fusion (pip) Left 04/07/2013    Procedure: PROXIMAL INTERPHALANGEAL FUSION (PIP)  SECOND DIGIT;  Surgeon: Francee Piccolo, MD;  Location: [place];  Service: Podiatry;  Laterality: Left;   Family History  Problem Relation Age of Onset  . Glaucoma Father   . Glaucoma Mother   . Diabetes Father   . Sleep apnea Father    Social History  Substance Use Topics  . Smoking status: Never Smoker   . Smokeless tobacco: Never Used  . Alcohol Use: Yes     Comment: RARE    Review of Systems  Constitutional: Negative.   HENT: Positive for dental problem.     Allergies  Penicillins; Ace inhibitors; and Peanut-containing drug products  Home Medications   Prior to Admission medications   Medication Sig Start Date End Date Taking? Authorizing Provider  albuterol (PROAIR HFA) 108 (90 BASE) MCG/ACT inhaler 2 puffs every 4 hours as needed only  if your can't catch your breath 03/10/15   Tanda Rockers, MD  albuterol (PROVENTIL) (2.5 MG/3ML) 0.083% nebulizer solution Take 3 mLs (2.5 mg total) by nebulization every 6 (six) hours as needed. 05/02/15   Chesley Mires, MD  budesonide-formoterol (SYMBICORT) 160-4.5 MCG/ACT inhaler Take 2 puffs first thing in am and then another 2 puffs about 12 hours later. 07/07/15   Tanda Rockers, MD  clindamycin (CLEOCIN) 300 MG capsule Take 1 capsule (300 mg total) by mouth 3 (three) times daily. 10/14/15   Billy Fischer, MD  diclofenac (CATAFLAM) 50 MG  tablet Take 1 tablet (50 mg total) by mouth 3 (three) times daily. For dental pain 10/14/15   Billy Fischer, MD  latanoprost (XALATAN) 0.005 % ophthalmic solution As directed 05/22/15   Historical Provider, MD  prednisoLONE acetate (PRED FORTE) 1 % ophthalmic suspension Place 1 drop into the right eye daily.     Historical Provider, MD   Meds Ordered and Administered this Visit  Medications - No data to display  BP 127/83 mmHg  Pulse 73  Temp(Src) 98.8 F (37.1 C) (Oral)  SpO2 99% No data found.   Physical Exam  Constitutional: He appears well-developed and well-nourished.  HENT:    Mouth/Throat: Oropharynx is clear and moist. Abnormal dentition. Dental abscesses present.    Nursing note and vitals reviewed.   ED Course  Procedures (including critical care time)  Labs Review Labs Reviewed - No data to display  Imaging Review No results found.   Visual Acuity Review  Right Eye Distance:   Left Eye Distance:   Bilateral Distance:    Right Eye Near:   Left Eye Near:    Bilateral Near:         MDM   1. Pain, dental        Billy Fischer, MD 10/14/15 [phone]

## 2015-10-14 NOTE — ED Notes (Signed)
Pt  Reports   r  Lower   Jaw  Toothache      X  4  Days         pt  Allergic to  Penicillin     Sitting upright on the  Exam table  Speaking in   Complete  sentances  In no  Acute /  Severe  Distress

## 2015-10-14 NOTE — Discharge Instructions (Signed)
Take medicine as prescribed, see your dentist as soon as possible °

## 2015-10-19 ENCOUNTER — Institutional Professional Consult (permissible substitution): Payer: PPO | Admitting: Pulmonary Disease

## 2016-01-30 DIAGNOSIS — H4010X3 Unspecified open-angle glaucoma, severe stage: Secondary | ICD-10-CM | POA: Diagnosis not present

## 2016-02-21 DIAGNOSIS — H2512 Age-related nuclear cataract, left eye: Secondary | ICD-10-CM | POA: Diagnosis not present

## 2016-02-21 DIAGNOSIS — Z961 Presence of intraocular lens: Secondary | ICD-10-CM | POA: Diagnosis not present

## 2016-02-21 DIAGNOSIS — H401133 Primary open-angle glaucoma, bilateral, severe stage: Secondary | ICD-10-CM | POA: Diagnosis not present

## 2016-07-02 DIAGNOSIS — Z8546 Personal history of malignant neoplasm of prostate: Secondary | ICD-10-CM | POA: Diagnosis not present

## 2016-07-02 DIAGNOSIS — N5231 Erectile dysfunction following radical prostatectomy: Secondary | ICD-10-CM | POA: Diagnosis not present

## 2016-07-18 DIAGNOSIS — N5231 Erectile dysfunction following radical prostatectomy: Secondary | ICD-10-CM | POA: Diagnosis not present

## 2016-07-18 DIAGNOSIS — Z8546 Personal history of malignant neoplasm of prostate: Secondary | ICD-10-CM | POA: Diagnosis not present

## 2016-07-18 DIAGNOSIS — R32 Unspecified urinary incontinence: Secondary | ICD-10-CM | POA: Diagnosis not present

## 2016-08-12 ENCOUNTER — Telehealth: Payer: Self-pay | Admitting: Internal Medicine

## 2016-08-12 MED ORDER — BUDESONIDE-FORMOTEROL FUMARATE 160-4.5 MCG/ACT IN AERO: INHALATION_SPRAY | RESPIRATORY_TRACT | 0 refills | Status: DC

## 2016-08-12 NOTE — Telephone Encounter (Signed)
Pt aware that we are sending a refill of his Symbicort 160mg  to the [facility] on [address] per patient request.  Pt scheduled for appt with MW on May 4th at 12pm. Nothing further needed.
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
## 2016-08-12 NOTE — Telephone Encounter (Signed)
Ok x one and see me before this runs out to regroup

## 2016-08-30 ENCOUNTER — Ambulatory Visit (INDEPENDENT_AMBULATORY_CARE_PROVIDER_SITE_OTHER): Payer: PPO | Admitting: Internal Medicine

## 2016-08-30 ENCOUNTER — Encounter: Payer: Self-pay | Admitting: Internal Medicine

## 2016-08-30 VITALS — BP 124/78 | HR 83 | Ht 66.25 in | Wt 173.2 lb

## 2016-08-30 DIAGNOSIS — J4540 Moderate persistent asthma, uncomplicated: Secondary | ICD-10-CM | POA: Diagnosis not present

## 2016-08-30 LAB — NITRIC OXIDE: NITRIC OXIDE: 7

## 2016-08-30 MED ORDER — BUDESONIDE-FORMOTEROL FUMARATE 160-4.5 MCG/ACT IN AERO: 2.0000 | INHALATION_SPRAY | Freq: Two times a day (BID) | RESPIRATORY_TRACT | 0 refills | Status: DC

## 2016-08-30 MED ORDER — ALBUTEROL SULFATE HFA 108 (90 BASE) MCG/ACT IN AERS: INHALATION_SPRAY | RESPIRATORY_TRACT | 2 refills | Status: DC

## 2016-08-30 MED ORDER — ALBUTEROL SULFATE (2.5 MG/3ML) 0.083% IN NEBU: 2.5000 mg | INHALATION_SOLUTION | Freq: Four times a day (QID) | RESPIRATORY_TRACT | 6 refills | Status: DC | PRN

## 2016-08-30 MED ORDER — BUDESONIDE-FORMOTEROL FUMARATE 160-4.5 MCG/ACT IN AERO: INHALATION_SPRAY | RESPIRATORY_TRACT | 11 refills | Status: DC

## 2016-08-30 NOTE — Progress Notes (Signed)
Subjective:    Patient ID: Derek RIETH, male   DOB: 1955-11-21      MRN: [id number]     Brief patient profile:  23 yobm never smoker asthma all his life but has periods up to several months of no symptoms and no need for inhalers but more severe sob and on daily meds x late Sept 2016 just consisting of saba and no more advair self referred back to pulmonary clinic p last ov with Dr Halford Chessman 03/2012     History of Present Illness  03/10/2015 acute extended ov/Derek Martinez re: sob/ Chief Complaint  Patient presents with  . Acute Visit    VS pt. C/o increase SOB and wheezing w/ exertion, no cough.  really unclear what he has at home as his ventolin is empty and not using advair at all but sob between nebs up to 4-6 x daily  Sob with any activity but confortable at rest assoc with gen chest tightness nothing localized. rec Plan A = Automatic = Dulera 200 Take 2 puffs first thing in am and then another 2 puffs about 12 hours later.  Plan B = Backup Only use your albuterol (proair) as a rescue medication  Plan C= Crisis - only use Nebulizer if you try proair/albuterol 1st and it doesn't work- ok to use up to every 4 hours    07/07/2015  f/u ov/Derek Martinez re: chronic asthma/ off dulera 200 x one week  Chief Complaint  Patient presents with  . Follow-up    Breathing has worse since he ran out of Dulera 1 little over 1 wk ago. Insurance does not prefer [employer]. He has been having to use his albuterol inhaler and neb daily.    on dulera no need at all for saba then gradually needed more p ran out  rec Plan A = Automatic = symbicort 160  Take 2 puffs first thing in am and then another 2 puffs about 12 hours later.  Plan B = Backup Only use your albuterol (proair) as a rescue medication Plan C= Crisis - only use Nebulizer if you try proair/albuterol 1st and it doesn't work- ok to use up to every 4 hours  Make appt to see Dr Halford Chessman to follow up on your sleep apnea next available     08/30/2016  f/u ov/Derek Martinez re:   Chronic asthma on symb 160 2bid Chief Complaint  Patient presents with  . Follow-up    Pt here for Symbicort refill. He states his breathing has been doing well.  He rarely uses albuterol inhaler and has not used neb.   rarely need saba in any form as long as on symbicort 160 consistently / has not needed neb saba  at all even when out of symbicort   No obvious day to day or daytime variability or assoc excess/ purulent sputum or mucus plugs or hemoptysis or cp or chest tightness, subjective wheeze or overt sinus or hb symptoms. No unusual exp hx or h/o childhood pna/ asthma or knowledge of premature birth.  Sleeping ok without nocturnal  or early am exacerbation  of respiratory  c/o's or need for noct saba. Also denies any obvious fluctuation of symptoms with weather or environmental changes or other aggravating or alleviating factors except as outlined above   Current Medications, Allergies, Complete Past Medical History, Past Surgical History, Family History, and Social History were reviewed in [employer] record.  ROS  The following are not active complaints unless bolded sore throat, dysphagia,  dental problems, itching, sneezing,  nasal congestion or excess/ purulent secretions, ear ache,   fever, chills, sweats, unintended wt loss, classically pleuritic or exertional cp,  orthopnea pnd or leg swelling, presyncope, palpitations, abdominal pain, anorexia, nausea, vomiting, diarrhea  or change in bowel or bladder habits, change in stools or urine, dysuria,hematuria,  rash, arthralgias, visual complaints, headache, numbness, weakness or ataxia or problems with walking or coordination,  change in mood/affect or memory.                  Objective:   Physical Exam  amb bm      08/30/2016          173  07/07/2015        173   03/10/15 167 lb 3.2 oz (75.841 kg)  04/07/13 163 lb (73.936 kg)  04/16/12 174 lb 6.4 oz (79.107 kg)     Vital signs reviewed  - Note on  arrival 02 sats  97% on RA    HEENT: nl dentition, turbinates, and oropharynx. Nl external ear canals without cough reflex   NECK :  without JVD/Nodes/TM/ nl carotid upstrokes bilaterally   LUNGS: clear to A and P    CV:  RRR  no s3 or murmur or increase in P2, no edema   ABD:  soft and nontender with nl excursion in the supine position. No bruits or organomegaly, bowel sounds nl  MS:  warm without deformities, calf tenderness, cyanosis or clubbing  SKIN: warm and dry without lesions    NEURO:  alert, approp, no deficits        Assessment:

## 2016-08-30 NOTE — Patient Instructions (Signed)
Plan A = Automatic = Symbicort 160 Take 2 puffs first thing in am and then another 2 puffs about 12 hours later.    Work on inhaler technique:  relax and gently blow all the way out then take a nice smooth deep breath back in, triggering the inhaler at same time you start breathing in.  Hold for up to 5 seconds if you can. Blow out thru nose. Rinse and gargle with water when done      Plan B = Backup Only use your albuterol as a rescue medication to be used if you can't catch your breath by resting or doing a relaxed purse lip breathing pattern.  - The less you use it, the better it will work when you need it. - Ok to use the inhaler up to 2 puffs  every 4 hours if you must but call for appointment if use goes up over your usual need - Don't leave home without it !!  (think of it like the spare tire for your car)   Plan C = Crisis - only use your albuterol nebulizer if you first try Plan B and it fails to help > ok to use the nebulizer up to every 4 hours but if start needing it regularly call for immediate appointment   Plan D = Doctor - call me if B and C not adequate  Plan E = ER - go to ER or call 911 if all else fails      If you are satisfied with your treatment plan,  let your doctor know and he/she can either refill your medications or you can return here when your prescription runs out.     If in any way you are not 100% satisfied,  please tell us.  If 100% better, tell your friends!  Pulmonary follow up is as needed

## 2016-08-31 NOTE — Assessment & Plan Note (Signed)
03/10/2015   try dulera 200 2bid > improved  - 07/07/2015   changed to symbicort 160 2bid due to insurance denying dulera  - 08/30/2016  After extensive coaching HFA effectiveness =   75% from baselin 50%  (Ti too short/ took both puffs at once)  - Spirometry 08/30/2016  FEV1 1.84 (68%)  Ratio 77 - FENO 08/30/2016  =   7    Despite suboptimal hfa, when patient uses his symbicort 160 consistently All goals of chronic asthma control met including optimal function and elimination of symptoms with minimal need for rescue therapy.  Contingencies discussed in full including contacting this office immediately if not controlling the symptoms using the rule of two's.     Next step might be step down to th 80 strength if he remains so well controlled chronically  I had an extended discussion with the patient reviewing all relevant studies completed to date and  lasting 15 to 20 minutes of a 25 minute visit    Each maintenance medication was reviewed in detail including most importantly the difference between maintenance and prns and under what circumstances the prns are to be triggered using an action plan format that is not reflected in the computer generated alphabetically organized AVS.    Please see AVS for specific instructions unique to this visit that I personally wrote and verbalized to the the pt in detail and then reviewed with pt  by my nurse highlighting any  changes in therapy recommended at today's visit to their plan of care.

## 2016-09-27 ENCOUNTER — Other Ambulatory Visit: Payer: Self-pay | Admitting: Internal Medicine

## 2016-09-28 ENCOUNTER — Other Ambulatory Visit: Payer: Self-pay | Admitting: Internal Medicine

## 2016-11-25 DIAGNOSIS — H5942 Inflammation (infection) of postprocedural bleb, stage 2: Secondary | ICD-10-CM | POA: Diagnosis not present

## 2016-11-25 DIAGNOSIS — H4440 Unspecified hypotony of eye: Secondary | ICD-10-CM | POA: Diagnosis not present

## 2016-11-25 DIAGNOSIS — H401133 Primary open-angle glaucoma, bilateral, severe stage: Secondary | ICD-10-CM | POA: Diagnosis not present

## 2016-11-26 DIAGNOSIS — H4440 Unspecified hypotony of eye: Secondary | ICD-10-CM | POA: Diagnosis not present

## 2016-11-26 DIAGNOSIS — H401133 Primary open-angle glaucoma, bilateral, severe stage: Secondary | ICD-10-CM | POA: Diagnosis not present

## 2016-11-26 DIAGNOSIS — H5942 Inflammation (infection) of postprocedural bleb, stage 2: Secondary | ICD-10-CM | POA: Diagnosis not present

## 2016-11-27 DIAGNOSIS — H4010X3 Unspecified open-angle glaucoma, severe stage: Secondary | ICD-10-CM | POA: Diagnosis not present

## 2016-11-27 DIAGNOSIS — H5940 Inflammation (infection) of postprocedural bleb, unspecified: Secondary | ICD-10-CM | POA: Diagnosis not present

## 2016-12-04 DIAGNOSIS — H5940 Inflammation (infection) of postprocedural bleb, unspecified: Secondary | ICD-10-CM | POA: Diagnosis not present

## 2016-12-04 DIAGNOSIS — H4010X3 Unspecified open-angle glaucoma, severe stage: Secondary | ICD-10-CM | POA: Diagnosis not present

## 2016-12-13 DIAGNOSIS — H5940 Inflammation (infection) of postprocedural bleb, unspecified: Secondary | ICD-10-CM | POA: Diagnosis not present

## 2016-12-13 DIAGNOSIS — H4010X3 Unspecified open-angle glaucoma, severe stage: Secondary | ICD-10-CM | POA: Diagnosis not present

## 2016-12-31 ENCOUNTER — Other Ambulatory Visit: Payer: Self-pay | Admitting: Internal Medicine

## 2016-12-31 ENCOUNTER — Telehealth: Payer: Self-pay | Admitting: Internal Medicine

## 2016-12-31 MED ORDER — BUDESONIDE-FORMOTEROL FUMARATE 160-4.5 MCG/ACT IN AERO: INHALATION_SPRAY | RESPIRATORY_TRACT | 11 refills | Status: DC

## 2016-12-31 NOTE — Telephone Encounter (Signed)
Pt requesting refill on symbicort.  rx sent to preferred pharmacy.  Nothing further needed.

## 2017-01-07 DIAGNOSIS — H4010X3 Unspecified open-angle glaucoma, severe stage: Secondary | ICD-10-CM | POA: Diagnosis not present

## 2017-02-18 ENCOUNTER — Other Ambulatory Visit: Payer: Self-pay | Admitting: Internal Medicine

## 2017-04-01 DIAGNOSIS — H4010X3 Unspecified open-angle glaucoma, severe stage: Secondary | ICD-10-CM | POA: Diagnosis not present

## 2017-04-10 ENCOUNTER — Other Ambulatory Visit: Payer: Self-pay | Admitting: Internal Medicine

## 2017-04-10 MED ORDER — ALBUTEROL SULFATE HFA 108 (90 BASE) MCG/ACT IN AERS
INHALATION_SPRAY | RESPIRATORY_TRACT | 0 refills | Status: DC
Start: 2017-04-10 — End: 2017-12-05

## 2017-04-11 ENCOUNTER — Telehealth: Payer: Self-pay | Admitting: Internal Medicine

## 2017-04-11 NOTE — Telephone Encounter (Signed)
Opened in error

## 2017-04-16 ENCOUNTER — Ambulatory Visit (HOSPITAL_COMMUNITY)
Admission: EM | Admit: 2017-04-16 | Discharge: 2017-04-16 | Disposition: A | Discharge status: Home / Self Care | Payer: PPO | Attending: Family Medicine | Admitting: Family Medicine

## 2017-04-16 ENCOUNTER — Encounter (HOSPITAL_COMMUNITY): Payer: Self-pay | Admitting: Emergency Medicine

## 2017-04-16 ENCOUNTER — Other Ambulatory Visit: Payer: Self-pay

## 2017-04-16 ENCOUNTER — Ambulatory Visit (INDEPENDENT_AMBULATORY_CARE_PROVIDER_SITE_OTHER): Payer: PPO

## 2017-04-16 DIAGNOSIS — J40 Bronchitis, not specified as acute or chronic: Secondary | ICD-10-CM

## 2017-04-16 DIAGNOSIS — R911 Solitary pulmonary nodule: Secondary | ICD-10-CM | POA: Diagnosis not present

## 2017-04-16 DIAGNOSIS — R05 Cough: Secondary | ICD-10-CM | POA: Diagnosis not present

## 2017-04-16 MED ORDER — FLUTICASONE PROPIONATE 50 MCG/ACT NA SUSP: 2.0000 | Freq: Every day | NASAL | 0 refills | Status: AC

## 2017-04-16 MED ORDER — CETIRIZINE-PSEUDOEPHEDRINE ER 5-120 MG PO TB12: 1.0000 | ORAL_TABLET | Freq: Every day | ORAL | 0 refills | Status: DC

## 2017-04-16 MED ORDER — BENZONATATE 100 MG PO CAPS: 100.0000 mg | ORAL_CAPSULE | Freq: Three times a day (TID) | ORAL | 0 refills | Status: DC

## 2017-04-16 NOTE — ED Notes (Signed)
Pt given CD of xray results

## 2017-04-16 NOTE — ED Triage Notes (Signed)
Cold symptoms for 1 1/2 weeks.  Patient states coughing up thick yellow phlegm, denies fever, no runny nose, but blowing thick mucus from nose.  Today woke with back pain, bilateral lower back pain.  Patient concerned for pneumonia.  Patient reports having pneumonia 10 years ago and how he feels today, reminds him of how he felt with pneumonia.

## 2017-04-16 NOTE — Discharge Instructions (Signed)
Your xray showed a nodule in the lungs that was not there in past xrays. This could be contributing to your cough. No pneumonia seen on xray. Please follow up with your pulmonologist for further evaluation of the lung nodule.  For now we will treat your cough/cold with: Tessalon for cough. Start flonase, zyrtec-D for nasal congestion. You can use over the counter nasal saline rinse such as neti pot for nasal congestion. Keep hydrated, your urine should be clear to pale yellow in color. Tylenol/motrin for fever and pain. Monitor for any worsening of symptoms, chest pain, shortness of breath, wheezing, swelling of the throat, follow up for reevaluation.

## 2017-04-16 NOTE — ED Provider Notes (Signed)
[place]    CSN: [id number] Arrival date & time: 04/16/17  1020     History   Chief Complaint Chief Complaint  Patient presents with  . URI    HPI Derek Martinez is a 61 y.o. male.   61 year old male with history of asthma, COPD, OSA with CPAP comes in for 1.5 week history of URI symptoms. He has had productive cough, nasal congestion. States he woke up with back pain that felt similar to when he had pneumonia. Has not needed to use albuterol until he ran out of Symbicort, last used albuterol 2-3 days ago as he restarted his Symbicort. Denies fever, chills, night sweats. Denies chest pain, shortness of breath, wheezing. States uses his CPAP machine once in awhile. Never smoker      Past Medical History:  Diagnosis Date  . Allergic asthma   . Allergic rhinitis   . Arthritis    RIGHT SHOULDER  . COPD (chronic obstructive pulmonary disease) (HCC)    PULMOLOGIST-- DR SOOD  . ED (erectile dysfunction)   . End-stage glaucoma    BILATERAL  . History of dislocation of shoulder    RIGHT 2008  . History of prostate cancer    S/P PROSTATECTOMY  04-14-2012  . Legally blind    BILATERAL  . OSA on CPAP    MILD-- PER LAST SLEEP STUDY 10-15-2011   . Wears glasses     Patient Active Problem List   Diagnosis Date Noted  . Asthma with acute exacerbation 03/11/2015  . Allergic rhinitis 04/16/2012  . OSA (obstructive sleep apnea) 09/03/2011  . Allergic asthma 09/03/2011    Past Surgical History:  Procedure Laterality Date  . BUNIONECTOMY Left 04/07/2013   Procedure: LAPIDUS BUNIONECTOMY/POSSIBLE ALSTON BUNIONECTOMY;  Surgeon: Francee Piccolo, MD;  Location: [place];  Service: Podiatry;  Laterality: Left;  . GLAUCOMA SURGERY Bilateral    x 5  (INCLUDING FILTERING BLEB)  . METATARSAL OSTEOTOMY Left 04/07/2013   Procedure: SECOND METATARSAL OSTEOTOMY;  Surgeon: Francee Piccolo, MD;  Location: [place];  Service: Podiatry;  Laterality:  Left;  . OSTECTOMY Left 04/07/2013   Procedure: AKIN OSTECTOMY;  Surgeon: Francee Piccolo, MD;  Location: [place];  Service: Podiatry;  Laterality: Left;  . PROSTATECTOMY  04-14-2012  [facility]  . PROXIMAL INTERPHALANGEAL FUSION (PIP) Left 04/07/2013   Procedure: PROXIMAL INTERPHALANGEAL FUSION (PIP) SECOND DIGIT;  Surgeon: Francee Piccolo, MD;  Location: [place];  Service: Podiatry;  Laterality: Left;       Home Medications    Prior to Admission medications   Medication Sig Start Date End Date Taking? Authorizing Provider  albuterol [facility] HFA) 108 (90 Base) MCG/ACT inhaler inhale 2 puffs by mouth every 4 hours 04/10/17   Tanda Rockers, MD  albuterol (PROVENTIL) (2.5 MG/3ML) 0.083% nebulizer solution inhale contents of 1 vial in nebulizer every 6 hours if needed 02/18/17   Tanda Rockers, MD  benzonatate (TESSALON) 100 MG capsule Take 1 capsule (100 mg total) by mouth every 8 (eight) hours. 04/16/17   Tasia Catchings, Amy V, PA-C  budesonide-formoterol (SYMBICORT) 160-4.5 MCG/ACT inhaler Take 2 puffs first thing in am and then another 2 puffs about 12 hours later. 12/31/16   Tanda Rockers, MD  cetirizine-pseudoephedrine (ZYRTEC-D) 5-120 MG tablet Take 1 tablet by mouth daily. 04/16/17   Tasia Catchings, Amy V, PA-C  dorzolamide-timolol (COSOPT) 22.3-6.8 MG/ML ophthalmic solution Place 1 drop into both eyes 2 (two) times daily.  [provider]  fluticasone (FLONASE) 50 MCG/ACT nasal spray Place 2 sprays into both nostrils daily. 04/16/17   Tasia Catchings, Amy V, PA-C  latanoprost (XALATAN) 0.005 % ophthalmic solution As directed 05/22/15   [provider]  [facility] 160-4.5 MCG/ACT inhaler inhale 2 puffs every 12 hours 12/31/16   Tanda Rockers, MD    Family History Family History  Problem Relation Age of Onset  . Glaucoma Father   . Diabetes Father   . Sleep apnea Father   . Glaucoma Mother     Social History Social History   Tobacco Use  . Smoking status: Never  Smoker  . Smokeless tobacco: Never Used  Substance Use Topics  . Alcohol use: Yes    Comment: RARE  . Drug use: No     Allergies   Penicillins; Ace inhibitors; and Peanut-containing drug products   Review of Systems Review of Systems  Reason unable to perform ROS: See HPI as above.     Physical Exam Triage Vital Signs ED Triage Vitals  Enc Vitals Group     BP 04/16/17 1052 126/85     Pulse Rate 04/16/17 1052 82     Resp 04/16/17 1052 18     Temp 04/16/17 1052 98.1 F (36.7 C)     Temp Source 04/16/17 1052 Oral     SpO2 04/16/17 1052 95 %     Weight --      Height --      Head Circumference --      Peak Flow --      Pain Score 04/16/17 1050 7     Pain Loc --      Pain Edu? --      Excl. in [place]? --    No data found.  Updated Vital Signs BP 126/85 (BP Location: Left Arm)   Pulse 82   Temp 98.1 F (36.7 C) (Oral)   Resp 18   SpO2 95%    Physical Exam  Constitutional: He is oriented to person, place, and time. He appears well-developed and well-nourished. No distress.  HENT:  Head: Normocephalic and atraumatic.  Right Ear: Tympanic membrane, external ear and ear canal normal. Tympanic membrane is not erythematous and not bulging.  Left Ear: Tympanic membrane, external ear and ear canal normal. Tympanic membrane is not erythematous and not bulging.  Nose: Nose normal. Right sinus exhibits no maxillary sinus tenderness and no frontal sinus tenderness. Left sinus exhibits no maxillary sinus tenderness and no frontal sinus tenderness.  Mouth/Throat: Uvula is midline, oropharynx is clear and moist and mucous membranes are normal.  Eyes: Conjunctivae are normal. Pupils are equal, round, and reactive to light.  Neck: Normal range of motion. Neck supple.  Cardiovascular: Normal rate, regular rhythm and normal heart sounds. Exam reveals no gallop and no friction rub.  No murmur heard. Pulmonary/Chest: Effort normal and breath sounds normal. He has no decreased breath  sounds. He has no wheezes. He has no rhonchi. He has no rales.  Lymphadenopathy:    He has no cervical adenopathy.  Neurological: He is alert and oriented to person, place, and time.  Skin: Skin is warm and dry.  Psychiatric: He has a normal mood and affect. His behavior is normal. Judgment normal.     UC Treatments / Results  Labs (all labs ordered are listed, but only abnormal results are displayed) Labs Reviewed - No data to display  EKG  EKG Interpretation None       Radiology Dg  Chest 2 View  Result Date: 04/16/2017 CLINICAL DATA:  Cough. EXAM: CHEST  2 VIEW COMPARISON:  Radiographs of April 07, 2013. FINDINGS: The heart size and mediastinal contours are within normal limits. No pneumothorax or pleural effusion is noted. Left lung is clear. New nodular density is seen in right upper lobe. The visualized skeletal structures are unremarkable. IMPRESSION: New nodular density seen in right upper lobe. CT scan of the chest is recommended to rule out neoplasm or nodule. No other abnormality seen in the chest. Electronically Signed   By: Marijo Conception, M.D.   On: 04/16/2017 11:36    Procedures Procedures (including critical care time)  Medications Ordered in UC Medications - No data to display   Initial Impression / Assessment and Plan / UC Course  I have reviewed the triage vital signs and the nursing notes.  Pertinent labs & imaging results that were available during my care of the patient were reviewed by me and considered in my medical decision making (see chart for details).    Discussed xray results with patient. Given patient has a pulmonologist, will have patient follow up with pulmonologist for further treatment and evaluation needed. Will provide patient with symptomatic treatment for URI symptoms for now. Return precautions given.   Case discussed with Dr Mannie Stabile, who agrees to plan.   Final Clinical Impressions(s) / UC Diagnoses   Final diagnoses:    Bronchitis  Pulmonary nodule    ED Discharge Orders        Ordered    benzonatate (TESSALON) 100 MG capsule  Every 8 hours     04/16/17 1146    fluticasone (FLONASE) 50 MCG/ACT nasal spray  Daily     04/16/17 1146    cetirizine-pseudoephedrine (ZYRTEC-D) 5-120 MG tablet  Daily     04/16/17 1146       Ok Edwards, [place] 04/16/17 1155

## 2017-04-18 ENCOUNTER — Ambulatory Visit: Payer: PPO | Admitting: Internal Medicine

## 2017-04-18 ENCOUNTER — Encounter: Payer: Self-pay | Admitting: Internal Medicine

## 2017-04-18 VITALS — BP 116/74 | HR 77 | Ht 66.25 in | Wt 175.2 lb

## 2017-04-18 DIAGNOSIS — J4531 Mild persistent asthma with (acute) exacerbation: Secondary | ICD-10-CM

## 2017-04-18 DIAGNOSIS — J189 Pneumonia, unspecified organism: Secondary | ICD-10-CM

## 2017-04-18 MED ORDER — AZITHROMYCIN 250 MG PO TABS: ORAL_TABLET | ORAL | 0 refills | Status: DC

## 2017-04-18 NOTE — Patient Instructions (Signed)
zpak   Please schedule a follow up office visit in 4 weeks, sooner if needed with cxr on return

## 2017-04-18 NOTE — Progress Notes (Signed)
Subjective:    Patient ID: Derek Martinez, male   DOB: Feb 22, 1956      MRN: [id number]     Brief patient profile:  61 yobm never smoker asthma all his life but has periods up to several months of no symptoms and no need for inhalers but more severe sob and on daily meds x late Sept 2016 just consisting of saba and no more advair self referred back to pulmonary clinic p last ov with Dr Halford Chessman 03/2012     History of Present Illness  03/10/2015 acute extended ov/Wert re: sob/ Chief Complaint  Patient presents with  . Acute Visit    VS pt. C/o increase SOB and wheezing w/ exertion, no cough.  really unclear what he has at home as his ventolin is empty and not using advair at all but sob between nebs up to 4-6 x daily  Sob with any activity but confortable at rest assoc with gen chest tightness nothing localized. rec Plan A = Automatic = Dulera 200 Take 2 puffs first thing in am and then another 2 puffs about 12 hours later.  Plan B = Backup Only use your albuterol (proair) as a rescue medication  Plan C= Crisis - only use Nebulizer if you try proair/albuterol 1st and it doesn't work- ok to use up to every 4 hours    07/07/2015  f/u ov/Wert re: chronic asthma/ off dulera 200 x one week  Chief Complaint  Patient presents with  . Follow-up    Breathing has worse since he ran out of Dulera 1 little over 1 wk ago. Insurance does not prefer [employer]. He has been having to use his albuterol inhaler and neb daily.    on dulera no need at all for saba then gradually needed more p ran out  rec Plan A = Automatic = symbicort 160  Take 2 puffs first thing in am and then another 2 puffs about 12 hours later.  Plan B = Backup Only use your albuterol (proair) as a rescue medication Plan C= Crisis - only use Nebulizer if you try proair/albuterol 1st and it doesn't work- ok to use up to every 4 hours  Make appt to see Dr Halford Chessman to follow up on your sleep apnea next available     08/30/2016  f/u ov/Wert re:   Chronic asthma on symb 160 2bid Chief Complaint  Patient presents with  . Follow-up    Pt here for Symbicort refill. He states his breathing has been doing well.  He rarely uses albuterol inhaler and has not used neb.   rarely need saba in any form as long as on symbicort 160 consistently / has not needed neb saba  at all even when out of symbicort  rec Plan A = Automatic = Symbicort 160 Take 2 puffs first thing in am and then another 2 puffs about 12 hours later.  Work on inhaler technique:   Plan B = Backup Only use your albuterol as a rescue medication Plan C = Crisis - only use your albuterol nebulizer    04/18/2017  f/u ov/Wert re:  Chronic asthma  Chief Complaint  Patient presents with  . Follow-up    Was told to follow up after visit at [facility] on 04/16/17. Was told that a new spot appeared on his lungs that was not there 2 years ago. Has had a cold for the past few weeks.     was  Doing file on symb 160 2  bid since last ov until acutely ill x 10 days prior to OV with severe cough and head congestion/ sorethroat, min productive sputum with no fever or cp and new density in   rul already rx as CAP with zpak and feeling much bette at time of ov  No obvious day to day or daytime variability or assoc excess/ purulent sputum or mucus plugs or hemoptysis or cp or chest tightness, subjective wheeze or overt sinus or hb symptoms. No unusual exposure hx or h/o childhood pna/ asthma or knowledge of premature birth.  Sleeping ok flat without nocturnal  or early am exacerbation  of respiratory  c/o's or need for noct saba. Also denies any obvious fluctuation of symptoms with weather or environmental changes or other aggravating or alleviating factors except as outlined above   Current Allergies, Complete Past Medical History, Past Surgical History, Family History, and Social History were reviewed in [employer] record.  ROS  The following are not active complaints  unless bolded Hoarseness, sore throat, dysphagia, dental problems, itching, sneezing,  nasal congestion or discharge of excess mucus or purulent secretions, ear ache,   fever, chills, sweats, unintended wt loss or wt gain, classically pleuritic or exertional cp,  orthopnea pnd or leg swelling, presyncope, palpitations, abdominal pain, anorexia, nausea, vomiting, diarrhea  or change in bowel habits or change in bladder habits, change in stools or change in urine, dysuria, hematuria,  rash, arthralgias, visual complaints, headache, numbness, weakness or ataxia or problems with walking or coordination,  change in mood/affect or memory.        Current Meds  Medication Sig  . albuterol (PROAIR HFA) 108 (90 Base) MCG/ACT inhaler inhale 2 puffs by mouth every 4 hours  . albuterol (PROVENTIL) (2.5 MG/3ML) 0.083% nebulizer solution inhale contents of 1 vial in nebulizer every 6 hours if needed  . benzonatate (TESSALON) 100 MG capsule Take 1 capsule (100 mg total) by mouth every 8 (eight) hours.  . budesonide-formoterol (SYMBICORT) 160-4.5 MCG/ACT inhaler Take 2 puffs first thing in am and then another 2 puffs about 12 hours later.  . cetirizine-pseudoephedrine (ZYRTEC-D) 5-120 MG tablet Take 1 tablet by mouth daily.  . dorzolamide-timolol (COSOPT) 22.3-6.8 MG/ML ophthalmic solution Place 1 drop into both eyes 2 (two) times daily.  . fluticasone (FLONASE) 50 MCG/ACT nasal spray Place 2 sprays into both nostrils daily.  Marland Kitchen latanoprost (XALATAN) 0.005 % ophthalmic solution As directed  . SYMBICORT 160-4.5 MCG/ACT inhaler inhale 2 puffs every 12 hours                   Objective:   Physical Exam  amb bm   nad   04/18/2017      175  08/30/2016          173  07/07/2015        173   03/10/15 167 lb 3.2 oz (75.841 kg)  04/07/13 163 lb (73.936 kg)  04/16/12 174 lb 6.4 oz (79.107 kg)      Vital signs reviewed - Note on arrival 02 sats  95% on RA   HEENT: nl dentition, turbinates bilaterally, and  oropharynx. Nl external ear canals without cough reflex   NECK :  without JVD/Nodes/TM/ nl carotid upstrokes bilaterally   LUNGS: no acc muscle use,  Nl contour chest which is clear to A and P bilaterally without cough on insp or exp maneuvers   CV:  RRR  no s3 or murmur or increase in P2, and no edema  ABD:  soft and nontender with nl inspiratory excursion in the supine position. No bruits or organomegaly appreciated, bowel sounds nl  MS:  Nl gait/ ext warm without deformities, calf tenderness, cyanosis or clubbing No obvious joint restrictions   SKIN: warm and dry without lesions    NEURO:  alert, approp, nl sensorium with  no motor or cerebellar deficits apparent.      I personally reviewed images and agree with radiology impression as follows:  CXR:   04/16/17 New nodular density seen in right upper lobe. CT scan of the chest is recommended to rule out neoplasm or nodule. No other abnormality seen in the chest.             Assessment:

## 2017-04-20 ENCOUNTER — Encounter: Payer: Self-pay | Admitting: Internal Medicine

## 2017-04-20 DIAGNOSIS — J189 Pneumonia, unspecified organism: Secondary | ICD-10-CM | POA: Insufficient documentation

## 2017-04-20 NOTE — Assessment & Plan Note (Addendum)
rx zpak 04/16/17  For right mid zone patch of as dz   dont' see clearly on lateral view so not sure RUL vs sup seg RLL but clinically improved p rx with zpak so just needs f/u cxr 4-6 weeks in this setting, not ct for now, esp in this never smoker   Discussed in detail all the  indications, usual  risks and alternatives  relative to the benefits with patient who agrees to proceed with conservative f/u as outlined

## 2017-04-20 NOTE — Assessment & Plan Note (Signed)
03/10/2015   try dulera 200 2bid > improved  - 07/07/2015   changed to symbicort 160 2bid due to insurance denying dulera  - 04/18/2017  After extensive coaching HFA effectiveness =   75%    Really not much of a flare already managed by UC with no change in maint or prns needed at this point  I had an extended discussion with the patient reviewing all relevant studies completed to date and  lasting 15 to 20 minutes of a 25 minute   visit    Each maintenance medication was reviewed in detail including most importantly the difference between maintenance and prns and under what circumstances the prns are to be triggered using an action plan format that is not reflected in the computer generated alphabetically organized AVS.    Please see AVS for specific instructions unique to this visit that I personally wrote and verbalized to the the pt in detail and then reviewed with pt  by my nurse highlighting any  changes in therapy recommended at today's visit to their plan of care.

## 2017-05-16 ENCOUNTER — Ambulatory Visit: Payer: PPO | Admitting: Internal Medicine

## 2017-05-23 ENCOUNTER — Ambulatory Visit: Payer: PPO | Admitting: Internal Medicine

## 2017-05-29 ENCOUNTER — Ambulatory Visit: Payer: PPO | Admitting: Internal Medicine

## 2017-08-05 DIAGNOSIS — H4010X3 Unspecified open-angle glaucoma, severe stage: Secondary | ICD-10-CM | POA: Diagnosis not present

## 2017-08-08 ENCOUNTER — Other Ambulatory Visit: Payer: Self-pay | Admitting: Internal Medicine

## 2017-08-08 ENCOUNTER — Telehealth: Payer: Self-pay | Admitting: Internal Medicine

## 2017-08-08 NOTE — Telephone Encounter (Signed)
Left message for patient to call back  

## 2017-08-11 MED ORDER — ALBUTEROL SULFATE (2.5 MG/3ML) 0.083% IN NEBU: INHALATION_SOLUTION | RESPIRATORY_TRACT | 0 refills | Status: DC

## 2017-08-11 NOTE — Telephone Encounter (Signed)
Spoke with pt. He is needing a refill on albuterol neb solution. Rx has been sent in. Nothing further was needed.

## 2017-09-06 ENCOUNTER — Other Ambulatory Visit: Payer: Self-pay | Admitting: Internal Medicine

## 2017-09-26 DIAGNOSIS — R197 Diarrhea, unspecified: Secondary | ICD-10-CM | POA: Diagnosis not present

## 2017-10-29 ENCOUNTER — Other Ambulatory Visit: Payer: Self-pay | Admitting: Internal Medicine

## 2017-10-29 ENCOUNTER — Telehealth: Payer: Self-pay | Admitting: Internal Medicine

## 2017-10-29 MED ORDER — BUDESONIDE-FORMOTEROL FUMARATE 160-4.5 MCG/ACT IN AERO: INHALATION_SPRAY | RESPIRATORY_TRACT | 0 refills | Status: DC

## 2017-10-29 NOTE — Telephone Encounter (Signed)
Called and spoke to pt.  Pt is requesting refill on Symbicort 160. Pt last seen 04/09/17 and instructed to f/u in 4 weeks. It does not appear that apt was scheduled.  Pt has been schedule for over due OV on 12/01/17. One month supply of symbicort has been sent to preferred pharmacy. Nothing further is needed.

## 2017-11-11 ENCOUNTER — Other Ambulatory Visit: Payer: Self-pay | Admitting: Internal Medicine

## 2017-11-11 MED ORDER — ALBUTEROL SULFATE (2.5 MG/3ML) 0.083% IN NEBU: INHALATION_SOLUTION | RESPIRATORY_TRACT | 0 refills | Status: DC

## 2017-12-01 ENCOUNTER — Ambulatory Visit: Payer: PPO | Admitting: Internal Medicine

## 2017-12-05 ENCOUNTER — Ambulatory Visit: Payer: PPO | Admitting: Pulmonary Disease

## 2017-12-05 ENCOUNTER — Encounter: Payer: Self-pay | Admitting: Pulmonary Disease

## 2017-12-05 ENCOUNTER — Ambulatory Visit (INDEPENDENT_AMBULATORY_CARE_PROVIDER_SITE_OTHER)
Admission: RE | Admit: 2017-12-05 | Discharge: 2017-12-05 | Disposition: A | Discharge status: Home / Self Care | Payer: PPO | Source: Ambulatory Visit | Attending: Pulmonary Disease | Admitting: Pulmonary Disease

## 2017-12-05 ENCOUNTER — Telehealth: Payer: Self-pay | Admitting: Pulmonary Disease

## 2017-12-05 VITALS — BP 128/82 | HR 104 | Ht 66.0 in | Wt 167.0 lb

## 2017-12-05 DIAGNOSIS — J4531 Mild persistent asthma with (acute) exacerbation: Secondary | ICD-10-CM | POA: Diagnosis not present

## 2017-12-05 DIAGNOSIS — J189 Pneumonia, unspecified organism: Secondary | ICD-10-CM

## 2017-12-05 DIAGNOSIS — R9389 Abnormal findings on diagnostic imaging of other specified body structures: Secondary | ICD-10-CM

## 2017-12-05 DIAGNOSIS — J4540 Moderate persistent asthma, uncomplicated: Secondary | ICD-10-CM

## 2017-12-05 DIAGNOSIS — Z0000 Encounter for general adult medical examination without abnormal findings: Secondary | ICD-10-CM | POA: Diagnosis not present

## 2017-12-05 DIAGNOSIS — J309 Allergic rhinitis, unspecified: Secondary | ICD-10-CM | POA: Diagnosis not present

## 2017-12-05 DIAGNOSIS — J45909 Unspecified asthma, uncomplicated: Secondary | ICD-10-CM | POA: Diagnosis not present

## 2017-12-05 DIAGNOSIS — Z23 Encounter for immunization: Secondary | ICD-10-CM

## 2017-12-05 MED ORDER — BUDESONIDE-FORMOTEROL FUMARATE 160-4.5 MCG/ACT IN AERO: 2.0000 | INHALATION_SPRAY | Freq: Two times a day (BID) | RESPIRATORY_TRACT | 3 refills | Status: DC

## 2017-12-05 MED ORDER — PREDNISONE 10 MG PO TABS: ORAL_TABLET | ORAL | 0 refills | Status: DC

## 2017-12-05 MED ORDER — ALBUTEROL SULFATE HFA 108 (90 BASE) MCG/ACT IN AERS: INHALATION_SPRAY | RESPIRATORY_TRACT | 11 refills | Status: DC

## 2017-12-05 NOTE — Progress Notes (Signed)
@Patient  ID: Derek Martinez, male    DOB: 01-11-1956, 62 y.o.   MRN: [id number]  Chief Complaint  Patient presents with  . Follow-up    states his breathing has been better, uses symbicort daily.     Referring provider: No ref. provider found  HPI: 62 year old male never smoker followed in our office for asthma. Maintenance: Symbicort  Patient of Dr. Melvyn Novas    Recent [facility] Encounters:   04/18/2017-office visit- [facility]  patient presenting to office after follow-up visit after visiting [facility] urgent care on 12/19/.  Was noted that there is a potential new spot on his lungs on his mother 2 years ago.  Patient reports doing fine on Symbicort 160 twice daily.  Was treated with Z-Pak feeling better. Plan: Needs follow-up checks her checks x-ray in 4 to 6 weeks.   Tests:  12/19 4/18-chest x-ray-new nodular density seen in right upper lobe 08/30/2016-spirometry- FVC 69%, FEV 168%, ratio 77, moderate restriction    12/05/17 OV  62 year old patient seen for follow-up appointment today.  Patient reports that he has been doing well with no concerns regarding his breathing.  Patient says that he has missed some of his Symbicort doses over the past couple of weeks.  Patient reports is been difficult for him to obtain his inhalers as he has not had regular follow-up with our office and is not established with primary care.  Patient reports that he does use his rescue inhaler 2 times last week.   Asthma Control Test Answer Options: All of the time-1, most of the time-2, some of the time-3, a little of the Tim[place], none of the time-5  1. In the past 4 weeks how much of your time did your asthma keep you from getting as much work done at school, home, work?   4 2. During the past 4 weeks how often if he had short of breath?  4 3. During the past 4 weeks how often is your asthma symptoms (wheezing, coughing, shortness of breath, chest tightness, or pain) wake you up at night or earlier than usual  in the morning?  5  4. During the past 4 weeks how often have you used her rescue inhaler and nebulized medications (such as albuterol)?  3 5. How would you rate your asthma control during the past 4 weeks?  Good - 5  If score is 19 or less indicates your asthma may not be under control.  Score: 21    Allergies  Allergen Reactions  . Penicillins Shortness Of Breath and Rash  . Ace Inhibitors   . Peanut-Containing Drug Products Swelling    Immunization History  Administered Date(s) Administered  . Pneumococcal Polysaccharide-23 12/05/2017    >>>needs pneumonia vaccine / flu vaccine needed  Past Medical History:  Diagnosis Date  . Allergic asthma   . Allergic rhinitis   . Arthritis    RIGHT SHOULDER  . COPD (chronic obstructive pulmonary disease) (HCC)    PULMOLOGIST-- DR SOOD  . ED (erectile dysfunction)   . End-stage glaucoma    BILATERAL  . History of dislocation of shoulder    RIGHT 2008  . History of prostate cancer    S/P PROSTATECTOMY  04-14-2012  . Legally blind    BILATERAL  . OSA on CPAP    MILD-- PER LAST SLEEP STUDY 10-15-2011   . Wears glasses     Tobacco History: Social History   Tobacco Use  Smoking Status Never Smoker  Smokeless Tobacco Never  Used   Counseling given: Not Answered  Continue not smoking   Outpatient Encounter Medications as of 12/05/2017  Medication Sig  . albuterol (PROAIR HFA) 108 (90 Base) MCG/ACT inhaler inhale 2 puffs by mouth every 4 hours  . albuterol (PROVENTIL) (2.5 MG/3ML) 0.083% nebulizer solution inhale contents of 1 vial in nebulizer every 6 hours if needed  . benzonatate (TESSALON) 100 MG capsule Take 1 capsule (100 mg total) by mouth every 8 (eight) hours.  . budesonide-formoterol (SYMBICORT) 160-4.5 MCG/ACT inhaler Take 2 puffs first thing in am and then another 2 puffs about 12 hours later.  . cetirizine-pseudoephedrine (ZYRTEC-D) 5-120 MG tablet Take 1 tablet by mouth daily.  . dorzolamide-timolol (COSOPT)  22.3-6.8 MG/ML ophthalmic solution Place 1 drop into both eyes 2 (two) times daily.  . fluticasone (FLONASE) 50 MCG/ACT nasal spray Place 2 sprays into both nostrils daily.  Marland Kitchen latanoprost (XALATAN) 0.005 % ophthalmic solution As directed  . SYMBICORT 160-4.5 MCG/ACT inhaler inhale 2 puffs every 12 hours  . [DISCONTINUED] albuterol (PROAIR HFA) 108 (90 Base) MCG/ACT inhaler inhale 2 puffs by mouth every 4 hours  . budesonide-formoterol (SYMBICORT) 160-4.5 MCG/ACT inhaler Inhale 2 puffs into the lungs every 12 (twelve) hours.  . predniSONE (DELTASONE) 10 MG tablet Take 2 tablets (20mg  total) daily for the next 5 days. Take in the AM with food.  . [DISCONTINUED] azithromycin (ZITHROMAX) 250 MG tablet Take 2 on day one then 1 daily x 4 days (Patient not taking: Reported on 12/05/2017)  . [DISCONTINUED] budesonide-formoterol (SYMBICORT) 160-4.5 MCG/ACT inhaler Inhale 2 puffs into the lungs every 12 (twelve) hours.   No facility-administered encounter medications on file as of 12/05/2017.      Review of Systems  Review of Systems  Constitutional: Negative for activity change, chills, fatigue, fever and unexpected weight change.  HENT: Negative for congestion and postnasal drip.   Respiratory: Negative for cough, shortness of breath and wheezing.   Cardiovascular: Negative for chest pain and palpitations.  Gastrointestinal: Negative for constipation, diarrhea, nausea and vomiting.  Genitourinary: Negative for hematuria and urgency.  Musculoskeletal: Negative for arthralgias.  Skin: Negative for color change.  Allergic/Immunologic: Positive for environmental allergies.  Neurological: Negative for dizziness, seizures and headaches.  Psychiatric/Behavioral: Negative for dysphoric mood. The patient is not nervous/anxious.   All other systems reviewed and are negative.     Physical Exam  BP 128/82   Pulse (!) 104   Ht 5\' 6"  (1.676 m)   Wt 167 lb (75.8 kg)   SpO2 97%   BMI 26.95 kg/m   Wt  Readings from Last 5 Encounters:  12/05/17 167 lb (75.8 kg)  04/18/17 175 lb 3.2 oz (79.5 kg)  08/30/16 173 lb 3.2 oz (78.6 kg)  07/07/15 162 lb (73.5 kg)  03/10/15 167 lb 3.2 oz (75.8 kg)     Physical Exam  Constitutional: He is oriented to person, place, and time and well-developed, well-nourished, and in no distress. No distress.  HENT:  Head: Normocephalic and atraumatic.  Right Ear: Hearing, tympanic membrane, external ear and ear canal normal.  Left Ear: Hearing, tympanic membrane, external ear and ear canal normal.  Mouth/Throat: Uvula is midline and oropharynx is clear and moist. No oropharyngeal exudate.  Eyes: Pupils are equal, round, and reactive to light.  Neck: Normal range of motion. Neck supple. No JVD present.  Cardiovascular: Normal rate, regular rhythm and normal heart sounds.  Pulmonary/Chest: Effort normal. No accessory muscle usage. No respiratory distress. He has no decreased breath sounds.  He has wheezes (LUL, exp wheeze). He has no rhonchi.  Abdominal: Soft. Bowel sounds are normal. There is no tenderness.  Musculoskeletal: Normal range of motion. He exhibits no edema.  Lymphadenopathy:    He has no cervical adenopathy.  Neurological: He is alert and oriented to person, place, and time. Gait normal.  Skin: Skin is warm and dry. He is not diaphoretic. No erythema.  Psychiatric: Mood, memory, affect and judgment normal.  Nursing note and vitals reviewed.     Lab Results:  CBC    Component Value Date/Time   WBC 9.4 08/12/2011 1232   RBC 4.36 08/12/2011 1232   HGB 15.3 04/07/2013 1305   HCT 41.5 08/12/2011 1232   PLT 205 08/12/2011 1232   MCV 95.2 08/12/2011 1232   MCH 31.9 08/12/2011 1232   MCHC 33.5 08/12/2011 1232   RDW 12.4 08/12/2011 1232   LYMPHSABS 1.4 08/12/2011 1232   MONOABS 0.6 08/12/2011 1232   EOSABS 0.4 08/12/2011 1232   BASOSABS 0.0 08/12/2011 1232    BMET    Component Value Date/Time   NA 137 08/12/2011 1232   K 3.6 08/12/2011  1232   CL 102 08/12/2011 1232   CO2 24 08/12/2011 1232   GLUCOSE 107 (H) 08/12/2011 1232   BUN 6 08/12/2011 1232   CREATININE 0.93 08/12/2011 1232   CALCIUM 9.0 08/12/2011 1232   GFRNONAA >90 08/12/2011 1232   GFRAA >90 08/12/2011 1232    BNP No results found for: BNP  ProBNP No results found for: PROBNP  Imaging: No results found.   Assessment & Plan:   Pleasant 61 year old patient seen today for follow-up visit.  Patient with asthma exacerbation on exam.  Left greater than right wheeze.  We will give patient a short course of prednisone as well as refill his medications.   Chest x-ray ordered today to follow the December/2018 nodularity seen.  Patient never completed follow-up appointment for follow-up chest x-ray as he was instructed at his last office visit.  Follow up in 3 months.   Asthma with acute exacerbation Chest Xray today   Prednisone 10mg  tablet  >>>Take 2 tablets (20 mg total) daily for the next 5 days >>> Take with food in the morning  Continue Symbicort >>> 2 puffs in the morning right when you wake up, rinse out your mouth after use, 12 hours later 2 puffs, rinse after use >>> Take this daily, no matter what >>> This is not a rescue inhaler   Only use your albuterol as a rescue medication to be used if you can't catch your breath by resting or doing a relaxed purse lip breathing pattern.  - The less you use it, the better it will work when you need it. - Ok to use up to 2 puffs  every 4 hours if you must but call for immediate appointment if use goes up over your usual need - Don't leave home without it !!  (think of it like the spare tire for your car)   Refilled: symbicort, albuterol  Pneumovax23 given today   Pt needs flu vaccine when available in SEPT/2019  Referral to establish with primary care   Follow up in 106mo with our office    Abnormal finding on imaging Cxry today   Allergic rhinitis Continue Flonase   Health care  maintenance Pneumovax 23 vaccine today Encourage patient to get flu vaccine when available in September/2019 Referral to be established with primary care      Lauraine Rinne,  NP 12/05/2017

## 2017-12-05 NOTE — Telephone Encounter (Signed)
Called patient unable to reach left message to give us a call back.

## 2017-12-05 NOTE — Patient Instructions (Addendum)
Chest Xray today   Prednisone 10mg  tablet  >>>Take 2 tablets (20 mg total) daily for the next 5 days >>> Take with food in the morning  Continue Symbicort >>> 2 puffs in the morning right when you wake up, rinse out your mouth after use, 12 hours later 2 puffs, rinse after use >>> Take this daily, no matter what >>> This is not a rescue inhaler   Only use your albuterol as a rescue medication to be used if you can't catch your breath by resting or doing a relaxed purse lip breathing pattern.  - The less you use it, the better it will work when you need it. - Ok to use up to 2 puffs  every 4 hours if you must but call for immediate appointment if use goes up over your usual need - Don't leave home without it !!  (think of it like the spare tire for your car)   Refilled: symbicort, albuterol  Pneumovax23 given today   Pt needs flu vaccine when available in SEPT/2019  Referral to establish with primary care     Follow up in 72mo with our office      Please contact the office if your symptoms worsen or you have concerns that you are not improving.   Thank you for choosing [facility] for your healthcare, and for allowing [place] to partner with you on your healthcare journey. I am thankful to be able to provide care to you today.   Wyn Quaker FNP-C   Asthma, Adult Asthma is a condition of the lungs in which the airways tighten and narrow. Asthma can make it hard to breathe. Asthma cannot be cured, but medicine and lifestyle changes can help control it. Asthma may be started (triggered) by:  Animal skin flakes (dander).  Dust.  Cockroaches.  Pollen.  Mold.  Smoke.  Cleaning products.  Hair sprays or aerosol sprays.  Paint fumes or strong smells.  Cold air, weather changes, and winds.  Crying or laughing hard.  Stress.  Certain medicines or drugs.  Foods, such as dried fruit, potato chips, and sparkling grape juice.  Infections or conditions (colds,  flu).  Exercise.  Certain medical conditions or diseases.  Exercise or tiring activities.  Follow these instructions at home:  Take medicine as told by your doctor.  Use a peak flow meter as told by your doctor. A peak flow meter is a tool that measures how well the lungs are working.  Record and keep track of the peak flow meter's readings.  Understand and use the asthma action plan. An asthma action plan is a written plan for taking care of your asthma and treating your attacks.  To help prevent asthma attacks: ? Do not smoke. Stay away from secondhand smoke. ? Change your heating and air conditioning filter often. ? Limit your use of fireplaces and wood stoves. ? Get rid of pests (such as roaches and mice) and their droppings. ? Throw away plants if you see mold on them. ? Clean your floors. Dust regularly. Use cleaning products that do not smell. ? Have someone vacuum when you are not home. Use a vacuum cleaner with a HEPA filter if possible. ? Replace carpet with wood, tile, or vinyl flooring. Carpet can trap animal skin flakes and dust. ? Use allergy-proof pillows, mattress covers, and box spring covers. ? Wash bed sheets and blankets every week in hot water and dry them in a dryer. ? Use blankets that are  made of polyester or cotton. ? Clean bathrooms and kitchens with bleach. If possible, have someone repaint the walls in these rooms with mold-resistant paint. Keep out of the rooms that are being cleaned and painted. ? Wash hands often. Contact a doctor if:  You have make a whistling sound when breaking (wheeze), have shortness of breath, or have a cough even if taking medicine to prevent attacks.  The colored mucus you cough up (sputum) is thicker than usual.  The colored mucus you cough up changes from clear or white to yellow, green, gray, or bloody.  You have problems from the medicine you are taking such as: ? A rash. ? Itching. ? Swelling. ? Trouble  breathing.  You need reliever medicines more than 2-3 times a week.  Your peak flow measurement is still at 50-79% of your personal best after following the action plan for 1 hour.  You have a fever. Get help right away if:  You seem to be worse and are not responding to medicine during an asthma attack.  You are short of breath even at rest.  You get short of breath when doing very little activity.  You have trouble eating, drinking, or talking.  You have chest pain.  You have a fast heartbeat.  Your lips or fingernails start to turn blue.  You are light-headed, dizzy, or faint.  Your peak flow is less than 50% of your personal best. This information is not intended to replace advice given to you by your health care provider. Make sure you discuss any questions you have with your health care provider. Document Released: 10/02/2007 Document Revised: 09/21/2015 Document Reviewed: 11/12/2012 Elsevier Interactive Patient Education  2017 [place].   Pneumococcal Vaccine, Polyvalent solution for injection What is this medicine? PNEUMOCOCCAL VACCINE, POLYVALENT (NEU mo KOK al vak SEEN, pol ee VEY luhnt) is a vaccine to prevent pneumococcus bacteria infection. These bacteria are a major cause of ear infections, Strep throat infections, and serious pneumonia, meningitis, or blood infections worldwide. These vaccines help the body to produce antibodies (protective substances) that help your body defend against these bacteria. This vaccine is recommended for people 52 years of age and older with health problems. It is also recommended for all adults over 49 years old. This vaccine will not treat an infection. This medicine may be used for other purposes; ask your health care provider or pharmacist if you have questions. COMMON BRAND NAME(S): Pneumovax 23 What should I tell my health care provider before I take this medicine? They need to know if you have any of these  conditions: -bleeding problems -bone marrow or organ transplant -cancer, Hodgkin's disease -fever -infection -immune system problems -low platelet count in the blood -seizures -an unusual or allergic reaction to pneumococcal vaccine, diphtheria toxoid, other vaccines, latex, other medicines, foods, dyes, or preservatives -pregnant or trying to get pregnant -breast-feeding How should I use this medicine? This vaccine is for injection into a muscle or under the skin. It is given by a health care professional. A copy of Vaccine Information Statements will be given before each vaccination. Read this sheet carefully each time. The sheet may change frequently. Talk to your pediatrician regarding the use of this medicine in children. While this drug may be prescribed for children as young as 47 years of age for selected conditions, precautions do apply. Overdosage: If you think you have taken too much of this medicine contact a poison control center or emergency room at once. NOTE: This  medicine is only for you. Do not share this medicine with others. What if I miss a dose? It is important not to miss your dose. Call your doctor or health care professional if you are unable to keep an appointment. What may interact with this medicine? -medicines for cancer chemotherapy -medicines that suppress your immune function -medicines that treat or prevent blood clots like warfarin, enoxaparin, and dalteparin -steroid medicines like prednisone or cortisone This list may not describe all possible interactions. Give your health care provider a list of all the medicines, herbs, non-prescription drugs, or dietary supplements you use. Also tell them if you smoke, drink alcohol, or use illegal drugs. Some items may interact with your medicine. What should I watch for while using this medicine? Mild fever and pain should go away in 3 days or less. Report any unusual symptoms to your doctor or health care  professional. What side effects may I notice from receiving this medicine? Side effects that you should report to your doctor or health care professional as soon as possible: -allergic reactions like skin rash, itching or hives, swelling of the face, lips, or tongue -breathing problems -confused -fever over 102 degrees F -pain, tingling, numbness in the hands or feet -seizures -unusual bleeding or bruising -unusual muscle weakness Side effects that usually do not require medical attention (report to your doctor or health care professional if they continue or are bothersome): -aches and pains -diarrhea -fever of 102 degrees F or less -headache -irritable -loss of appetite -pain, tender at site where injected -trouble sleeping This list may not describe all possible side effects. Call your doctor for medical advice about side effects. You may report side effects to FDA at 1-800-FDA-1088. Where should I keep my medicine? This does not apply. This vaccine is given in a clinic, pharmacy, doctor's office, or other health care setting and will not be stored at home. NOTE: This sheet is a summary. It may not cover all possible information. If you have questions about this medicine, talk to your doctor, pharmacist, or health care provider.  2018 Elsevier/Gold Standard (2007-11-20 14:32:37)

## 2017-12-05 NOTE — Assessment & Plan Note (Signed)
Pneumovax 23 vaccine today Encourage patient to get flu vaccine when available in September/2019 Referral to be established with primary care

## 2017-12-05 NOTE — Progress Notes (Signed)
Your chest x-ray results of come back.  Showing no acute changes.  No plan of care changes at this time.  Keep follow-up appointment.    Follow-up with our office if symptoms worsen or you do not feel like you are improving under her current regimen.  It was a pleasure taking care of you,  Norrine Ballester, FNP 

## 2017-12-05 NOTE — Assessment & Plan Note (Signed)
Chest Xray today   Prednisone 10mg  tablet  >>>Take 2 tablets (20 mg total) daily for the next 5 days >>> Take with food in the morning  Continue Symbicort >>> 2 puffs in the morning right when you wake up, rinse out your mouth after use, 12 hours later 2 puffs, rinse after use >>> Take this daily, no matter what >>> This is not a rescue inhaler   Only use your albuterol as a rescue medication to be used if you can't catch your breath by resting or doing a relaxed purse lip breathing pattern.  - The less you use it, the better it will work when you need it. - Ok to use up to 2 puffs  every 4 hours if you must but call for immediate appointment if use goes up over your usual need - Don't leave home without it !!  (think of it like the spare tire for your car)   Refilled: symbicort, albuterol  Pneumovax23 given today   Pt needs flu vaccine when available in SEPT/2019  Referral to establish with primary care   Follow up in 81mo with our office

## 2017-12-05 NOTE — Assessment & Plan Note (Signed)
-   Continue Flonase  °

## 2017-12-05 NOTE — Progress Notes (Signed)
Chart and office note reviewed in detail  > agree with a/p as outlined    

## 2017-12-05 NOTE — Assessment & Plan Note (Signed)
Cxry today

## 2017-12-08 NOTE — Telephone Encounter (Signed)
Spoke with pt, aware of results/recs.  Nothing further needed.  

## 2018-03-09 ENCOUNTER — Ambulatory Visit: Payer: PPO | Admitting: Internal Medicine

## 2018-03-24 ENCOUNTER — Telehealth: Payer: Self-pay | Admitting: Internal Medicine

## 2018-03-24 ENCOUNTER — Other Ambulatory Visit: Payer: Self-pay | Admitting: Internal Medicine

## 2018-03-24 DIAGNOSIS — J4531 Mild persistent asthma with (acute) exacerbation: Secondary | ICD-10-CM

## 2018-03-24 NOTE — Telephone Encounter (Signed)
Left message for patient to call back. Per his chart, he is overdue for an OV with MW.

## 2018-03-25 MED ORDER — BUDESONIDE-FORMOTEROL FUMARATE 160-4.5 MCG/ACT IN AERO: 2.0000 | INHALATION_SPRAY | Freq: Two times a day (BID) | RESPIRATORY_TRACT | 0 refills | Status: DC

## 2018-03-25 NOTE — Telephone Encounter (Signed)
Spoke with pt. He is requesting a refill on Symbicort. Advised him that he would need an appointment with MW. This has been scheduled for 04/06/18 at 10:30am. Rx has been sent in. Nothing further was needed.

## 2018-03-31 DIAGNOSIS — H4010X3 Unspecified open-angle glaucoma, severe stage: Secondary | ICD-10-CM | POA: Diagnosis not present

## 2018-04-06 ENCOUNTER — Ambulatory Visit (INDEPENDENT_AMBULATORY_CARE_PROVIDER_SITE_OTHER): Payer: PPO | Admitting: Internal Medicine

## 2018-04-06 ENCOUNTER — Encounter: Payer: Self-pay | Admitting: Internal Medicine

## 2018-04-06 VITALS — BP 130/76 | HR 78 | Ht 66.0 in | Wt 174.0 lb

## 2018-04-06 DIAGNOSIS — J4530 Mild persistent asthma, uncomplicated: Secondary | ICD-10-CM

## 2018-04-06 NOTE — Patient Instructions (Addendum)
No change symbicort 160 Take 2 puffs first thing in am and then another 2 puffs about 12 hours later.   Only use your albuterol as a rescue medication to be used if you can't catch your breath by resting or doing a relaxed purse lip breathing pattern.  - The less you use it, the better it will work when you need it. - Ok to use up to 2 puffs  every 4 hours if you must but call for immediate appointment if use goes up over your usual need - Don't leave home without it !!  (think of it like the spare tire for your car)    Please schedule a follow up visit in 12  months but call sooner if needed

## 2018-04-06 NOTE — Progress Notes (Signed)
Subjective:    Patient ID: Derek Martinez, male   DOB: 1955/05/15      MRN: [id number]     Brief patient profile:  44 yobm never smoker asthma all his life but has periods up to several months of no symptoms and no need for inhalers but more severe sob and on daily meds x late Sept 2016 just consisting of saba and no more advair self referred back to pulmonary clinic p last ov with Dr Halford Chessman 03/2012     History of Present Illness  03/10/2015 acute extended ov/Derek Martinez re: sob/ Chief Complaint  Patient presents with  . Acute Visit    VS pt. C/o increase SOB and wheezing w/ exertion, no cough.  really unclear what he has at home as his ventolin is empty and not using advair at all but sob between nebs up to 4-6 x daily  Sob with any activity but confortable at rest assoc with gen chest tightness nothing localized. rec Plan A = Automatic = Dulera 200 Take 2 puffs first thing in am and then another 2 puffs about 12 hours later.  Plan B = Backup Only use your albuterol (proair) as a rescue medication  Plan C= Crisis - only use Nebulizer if you try proair/albuterol 1st and it doesn't work- ok to use up to every 4 hours    07/07/2015  f/u ov/Derek Martinez re: chronic asthma/ off dulera 200 x one week  Chief Complaint  Patient presents with  . Follow-up    Breathing has worse since he ran out of Dulera 1 little over 1 wk ago. Insurance does not prefer [employer]. He has been having to use his albuterol inhaler and neb daily.    on dulera no need at all for saba then gradually needed more p ran out  rec Plan A = Automatic = symbicort 160  Take 2 puffs first thing in am and then another 2 puffs about 12 hours later.  Plan B = Backup Only use your albuterol (proair) as a rescue medication Plan C= Crisis - only use Nebulizer if you try proair/albuterol 1st and it doesn't work- ok to use up to every 4 hours  Make appt to see Dr Halford Chessman to follow up on your sleep apnea next available     08/30/2016  f/u ov/Derek Martinez re:   Chronic asthma on symb 160 2bid Chief Complaint  Patient presents with  . Follow-up    Pt here for Symbicort refill. He states his breathing has been doing well.  He rarely uses albuterol inhaler and has not used neb.   rarely need saba in any form as long as on symbicort 160 consistently / has not needed neb saba  at all even when out of symbicort  rec Plan A = Automatic = Symbicort 160 Take 2 puffs first thing in am and then another 2 puffs about 12 hours later.  Work on inhaler technique:   Plan B = Backup Only use your albuterol as a rescue medication Plan C = Crisis - only use your albuterol nebulizer       04/06/2018  f/u ov/Derek Martinez re:  Chronic asthma  maint on symbicort 160  Chief Complaint  Patient presents with  . Follow-up    Breathing is overall doing well. He rarely uses his albuterol inhaler or neb.   Dyspnea:  Not limited by breathing from desired activities   Cough: no  Sleeping: no longer on cpap x sev years / not aware of noct,  Seems brighter p using it  SABA use: rarely as long as takes symb  02: none     No obvious day to day or daytime variability or assoc excess/ purulent sputum or mucus plugs or hemoptysis or cp or chest tightness, subjective wheeze or overt sinus or hb symptoms.   Sleeping on cpap as above  without nocturnal  or early am exacerbation  of respiratory  c/o's or need for noct saba. Also denies any obvious fluctuation of symptoms with weather or environmental changes or other aggravating or alleviating factors except as outlined above   No unusual exposure hx or h/o childhood pna/ asthma or knowledge of premature birth.  Current Allergies, Complete Past Medical History, Past Surgical History, Family History, and Social History were reviewed in [employer] record.  ROS  The following are not active complaints unless bolded Hoarseness, sore throat, dysphagia, dental problems, itching, sneezing,  nasal congestion or  discharge of excess mucus or purulent secretions, ear ache,   fever, chills, sweats, unintended wt loss or wt gain, classically pleuritic or exertional cp,  orthopnea pnd or arm/hand swelling  or leg swelling, presyncope, palpitations, abdominal pain, anorexia, nausea, vomiting, diarrhea  or change in bowel habits or change in bladder habits, change in stools or change in urine, dysuria, hematuria,  rash, arthralgias, visual complaints, headache, numbness, weakness or ataxia or problems with walking or coordination,  change in mood or  memory.        Current Meds  Medication Sig  . albuterol (PROAIR HFA) 108 (90 Base) MCG/ACT inhaler inhale 2 puffs by mouth every 4 hours  . albuterol (PROVENTIL) (2.5 MG/3ML) 0.083% nebulizer solution inhale contents of 1 vial in nebulizer every 6 hours if needed  . benzonatate (TESSALON) 100 MG capsule Take 1 capsule (100 mg total) by mouth every 8 (eight) hours.  . cetirizine-pseudoephedrine (ZYRTEC-D) 5-120 MG tablet Take 1 tablet by mouth daily.  . dorzolamide-timolol (COSOPT) 22.3-6.8 MG/ML ophthalmic solution Place 1 drop into both eyes 2 (two) times daily.  . fluticasone (FLONASE) 50 MCG/ACT nasal spray Place 2 sprays into both nostrils daily.  Marland Kitchen latanoprost (XALATAN) 0.005 % ophthalmic solution As directed  . SYMBICORT 160-4.5 MCG/ACT inhaler INHALE 2 PUFFS BY MOUTH EVERY 12 HOURS                  Objective:   Physical Exam  amb bm min hoarse    04/06/2018        174 04/18/2017      175  08/30/2016          173  07/07/2015        173   03/10/15 167 lb 3.2 oz (75.841 kg)  04/07/13 163 lb (73.936 kg)  04/16/12 174 lb 6.4 oz (79.107 kg)      Vital signs reviewed - Note on arrival 02 sats  96% on RA     HEENT: nl dentition, turbinates bilaterally, and oropharynx. Nl external ear canals without cough reflex   NECK :  without JVD/Nodes/TM/ nl carotid upstrokes bilaterally   LUNGS: no acc muscle use,  Nl contour chest which is clear to A and P  bilaterally without cough on insp or exp maneuvers   CV:  RRR  no s3 or murmur or increase in P2, and no edema   ABD:  soft and nontender with nl inspiratory excursion in the supine position. No bruits or organomegaly appreciated, bowel sounds nl  MS:  Nl gait/ ext warm  without deformities, calf tenderness, cyanosis or clubbing No obvious joint restrictions   SKIN: warm and dry without lesions    NEURO:  alert, approp, nl sensorium with  no motor or cerebellar deficits apparent.       I personally reviewed images and agree with radiology impression as follows:  CXR:   12/05/17  No active cardiopulmonary disease.           Assessment:

## 2018-04-07 ENCOUNTER — Encounter: Payer: Self-pay | Admitting: Internal Medicine

## 2018-04-07 NOTE — Assessment & Plan Note (Addendum)
03/10/2015   try dulera 200 2bid > improved  - 07/07/2015   changed to symbicort 160 2bid due to insurance denying dulera  - 08/30/2016  After extensive coaching HFA effectiveness =   75% from baselin 50%  (Ti too short/ took both puffs at once)  - Spirometry 08/30/2016  FEV1 1.84 (68%)  Ratio 77 s curvature  - FENO 08/30/2016  =   7    - 04/06/2018  After extensive coaching inhaler device,  effectiveness =    90%   All goals of chronic asthma control met including optimal function and elimination of symptoms with minimal need for rescue therapy.  Contingencies discussed in full including contacting this office immediately if not controlling the symptoms using the rule of two's.     I had an extended discussion with the patient reviewing all relevant studies completed to date and  lasting 15 to 20 minutes of a 25 minute annual visit  Updating all aspects of asthma goals and care.  See device teaching which extended face to face time for this visit.  Each maintenance medication was reviewed in detail including emphasizing most importantly the difference between maintenance and prns and under what circumstances the prns are to be triggered using an action plan format that is not reflected in the computer generated alphabetically organized AVS which I have not found useful in most complex patients, especially with respiratory illnesses  Please see AVS for specific instructions unique to this visit that I personally wrote and verbalized to the the pt in detail and then reviewed with pt  by my nurse highlighting any  changes in therapy recommended at today's visit to their plan of care.

## 2018-04-20 DIAGNOSIS — N5231 Erectile dysfunction following radical prostatectomy: Secondary | ICD-10-CM | POA: Diagnosis not present

## 2018-04-20 DIAGNOSIS — C61 Malignant neoplasm of prostate: Secondary | ICD-10-CM | POA: Diagnosis not present

## 2018-04-20 DIAGNOSIS — N39498 Other specified urinary incontinence: Secondary | ICD-10-CM | POA: Diagnosis not present

## 2018-04-20 DIAGNOSIS — R32 Unspecified urinary incontinence: Secondary | ICD-10-CM | POA: Diagnosis not present

## 2018-05-05 DIAGNOSIS — H4010X3 Unspecified open-angle glaucoma, severe stage: Secondary | ICD-10-CM | POA: Diagnosis not present

## 2018-05-05 DIAGNOSIS — Z961 Presence of intraocular lens: Secondary | ICD-10-CM | POA: Diagnosis not present

## 2018-06-02 DIAGNOSIS — Z961 Presence of intraocular lens: Secondary | ICD-10-CM | POA: Diagnosis not present

## 2018-06-02 DIAGNOSIS — H4010X3 Unspecified open-angle glaucoma, severe stage: Secondary | ICD-10-CM | POA: Diagnosis not present

## 2018-06-17 DIAGNOSIS — Z9883 Filtering (vitreous) bleb after glaucoma surgery status: Secondary | ICD-10-CM | POA: Diagnosis not present

## 2018-06-17 DIAGNOSIS — H401111 Primary open-angle glaucoma, right eye, mild stage: Secondary | ICD-10-CM | POA: Diagnosis not present

## 2018-06-17 DIAGNOSIS — Z9889 Other specified postprocedural states: Secondary | ICD-10-CM | POA: Diagnosis not present

## 2018-06-17 DIAGNOSIS — H44431 Hypotony of eye due to other ocular disorders, right eye: Secondary | ICD-10-CM | POA: Diagnosis not present

## 2018-06-17 DIAGNOSIS — H401113 Primary open-angle glaucoma, right eye, severe stage: Secondary | ICD-10-CM | POA: Diagnosis not present

## 2018-06-17 DIAGNOSIS — H4440 Unspecified hypotony of eye: Secondary | ICD-10-CM | POA: Diagnosis not present

## 2018-06-17 DIAGNOSIS — T8183XA Persistent postprocedural fistula, initial encounter: Secondary | ICD-10-CM | POA: Diagnosis not present

## 2018-06-17 DIAGNOSIS — J45909 Unspecified asthma, uncomplicated: Secondary | ICD-10-CM | POA: Diagnosis not present

## 2018-06-17 DIAGNOSIS — G4730 Sleep apnea, unspecified: Secondary | ICD-10-CM | POA: Diagnosis not present

## 2018-06-17 DIAGNOSIS — Z79899 Other long term (current) drug therapy: Secondary | ICD-10-CM | POA: Diagnosis not present

## 2018-06-17 DIAGNOSIS — Z880 Allergy status to penicillin: Secondary | ICD-10-CM | POA: Diagnosis not present

## 2018-06-21 DIAGNOSIS — H538 Other visual disturbances: Secondary | ICD-10-CM | POA: Diagnosis not present

## 2018-06-21 DIAGNOSIS — Z8546 Personal history of malignant neoplasm of prostate: Secondary | ICD-10-CM | POA: Diagnosis not present

## 2018-06-21 DIAGNOSIS — H11421 Conjunctival edema, right eye: Secondary | ICD-10-CM | POA: Diagnosis not present

## 2018-06-21 DIAGNOSIS — H53149 Visual discomfort, unspecified: Secondary | ICD-10-CM | POA: Diagnosis not present

## 2018-06-21 DIAGNOSIS — H2101 Hyphema, right eye: Secondary | ICD-10-CM | POA: Diagnosis not present

## 2018-07-08 ENCOUNTER — Other Ambulatory Visit: Payer: Self-pay

## 2018-07-08 NOTE — Patient Outreach (Signed)
[facility]) Care Management  07/08/2018  Derek Martinez 17-Dec-1955 [id number]   Telephone Screen  Referral Date: 06/23/2018 Referral Source: [facility]. Referral Reason: "needs med reconciliation, may need transportation assistance" Insurance: [facility] Referral transferred from D. Green,RN on 07/08/2018   Outreach attempt # 1 to patient. Spoke with patient regarding referral source and reason. Patient denies any issues affording and/or managing his meds. He voices that he takes less than 10 meds. He is able to manage and keep track of his own meds and declined med reconciliation. He states that he is able to drive himself to appts. He sees PCP-Dr.Polite locally but hisother MDs (oncologist and urologist) are with [facility]. He voices that he recently had eye surgery and is recovering. He has follow up appt with eye doctor in [place] coming up soon. He has not been cleared to drive. Patient inquiring if [facility] would pay someone to take him to appt. Advised patient that this is not what [facility] does. He is aware that transportation agencies do not cross [address]. RN CM strongly encouraged patient to seek out family/friends who can take him to this one appt until he is able to drive again. He denies needing any other [facility] services at this time.    Plan: RN CM will close case at this time.     Enzo Montgomery, RN,BSN,CCM [facility] Management Telephonic Care Management Coordinator Direct Phone: [phone] Toll Free: [phone] Fax: [phone]

## 2018-08-03 ENCOUNTER — Telehealth: Payer: Self-pay | Admitting: Internal Medicine

## 2018-08-03 ENCOUNTER — Other Ambulatory Visit: Payer: Self-pay | Admitting: Internal Medicine

## 2018-08-03 DIAGNOSIS — J4531 Mild persistent asthma with (acute) exacerbation: Secondary | ICD-10-CM

## 2018-08-03 MED ORDER — ALBUTEROL SULFATE HFA 108 (90 BASE) MCG/ACT IN AERS: INHALATION_SPRAY | RESPIRATORY_TRACT | 1 refills | Status: DC

## 2018-08-03 MED ORDER — ALBUTEROL SULFATE HFA 108 (90 BASE) MCG/ACT IN AERS: INHALATION_SPRAY | RESPIRATORY_TRACT | 3 refills | Status: DC

## 2018-08-03 MED ORDER — ALBUTEROL SULFATE (2.5 MG/3ML) 0.083% IN NEBU: INHALATION_SOLUTION | RESPIRATORY_TRACT | 3 refills | Status: AC

## 2018-08-03 NOTE — Telephone Encounter (Signed)
Left message informing patient refills have been sent. Nothing further is needed at this time.

## 2018-10-01 DIAGNOSIS — Z961 Presence of intraocular lens: Secondary | ICD-10-CM | POA: Diagnosis not present

## 2018-10-01 DIAGNOSIS — H4010X3 Unspecified open-angle glaucoma, severe stage: Secondary | ICD-10-CM | POA: Diagnosis not present

## 2018-10-27 ENCOUNTER — Telehealth: Payer: Self-pay | Admitting: Internal Medicine

## 2018-10-27 DIAGNOSIS — Z20822 Contact with and (suspected) exposure to covid-19: Secondary | ICD-10-CM

## 2018-10-27 NOTE — Telephone Encounter (Signed)
Spoke to pt and he wants to be covid tested.  He is in the area for a funeral and wanted to be tested before he went home.  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  He is not showing any signs or symptoms right now.  Dr Melvyn Novas, please advise.  Thanks!

## 2018-10-27 NOTE — Telephone Encounter (Signed)
Will route to [facility] for COVID testing, we only arrange the pre-procedure testing for COVID.

## 2018-10-27 NOTE — Telephone Encounter (Signed)
Patient scheduled for covid-19 testing in the am 10/28/18 @ [facility]. Instructions given and order placed. Sent patient a text to activate mychart.

## 2018-10-27 NOTE — Telephone Encounter (Signed)
Fine to refer for testing but there will be a delay based on back log - check with Derek Martinez if any problems with logistics

## 2018-10-28 ENCOUNTER — Other Ambulatory Visit: Payer: Self-pay

## 2018-10-28 DIAGNOSIS — Z20822 Contact with and (suspected) exposure to covid-19: Secondary | ICD-10-CM

## 2018-10-28 DIAGNOSIS — R6889 Other general symptoms and signs: Secondary | ICD-10-CM | POA: Diagnosis not present

## 2018-11-02 ENCOUNTER — Telehealth: Payer: Self-pay | Admitting: *Deleted

## 2018-11-02 LAB — NOVEL CORONAVIRUS, NAA: SARS-CoV-2, NAA: NOT DETECTED

## 2018-11-02 NOTE — Telephone Encounter (Signed)
Pt calling for Covid results; negative. Reviewed with pt, verbalizes understanding.

## 2019-01-12 ENCOUNTER — Other Ambulatory Visit: Payer: Self-pay | Admitting: Internal Medicine

## 2019-01-12 MED ORDER — BUDESONIDE-FORMOTEROL FUMARATE 160-4.5 MCG/ACT IN AERO: INHALATION_SPRAY | RESPIRATORY_TRACT | 2 refills | Status: DC

## 2019-01-14 ENCOUNTER — Telehealth: Payer: Self-pay | Admitting: Internal Medicine

## 2019-01-14 MED ORDER — BUDESONIDE-FORMOTEROL FUMARATE 160-4.5 MCG/ACT IN AERO
INHALATION_SPRAY | RESPIRATORY_TRACT | 2 refills | Status: DC
Start: 2019-01-14 — End: 2019-01-20

## 2019-01-14 NOTE — Telephone Encounter (Signed)
Called and spoke to pt. Pt states he is currently in [facility] and is requesting a refill of his Symbicort. New rx sent to preferred pharmacy. Pt verbalized understanding and denied any further questions or concerns at this time.

## 2019-01-19 DIAGNOSIS — L03213 Periorbital cellulitis: Secondary | ICD-10-CM | POA: Diagnosis not present

## 2019-01-20 ENCOUNTER — Other Ambulatory Visit: Payer: Self-pay | Admitting: Internal Medicine

## 2019-01-22 DIAGNOSIS — H0011 Chalazion right upper eyelid: Secondary | ICD-10-CM | POA: Diagnosis not present

## 2019-02-27 ENCOUNTER — Other Ambulatory Visit: Payer: Self-pay

## 2019-02-27 DIAGNOSIS — Z20822 Contact with and (suspected) exposure to covid-19: Secondary | ICD-10-CM

## 2019-02-28 LAB — NOVEL CORONAVIRUS, NAA: SARS-CoV-2, NAA: NOT DETECTED

## 2019-03-02 ENCOUNTER — Telehealth: Payer: Self-pay | Admitting: General Practice

## 2019-03-02 NOTE — Telephone Encounter (Signed)
Gave patient negative covid test  °Patient understood  °

## 2019-04-06 IMAGING — DX DG CHEST 2V
2 series · 2 of 2 positions shown · non-contrast
Comparison: Radiographs April 07, 2013.

CLINICAL DATA: Cough.

EXAM:
CHEST  2 VIEW

[chest pa]
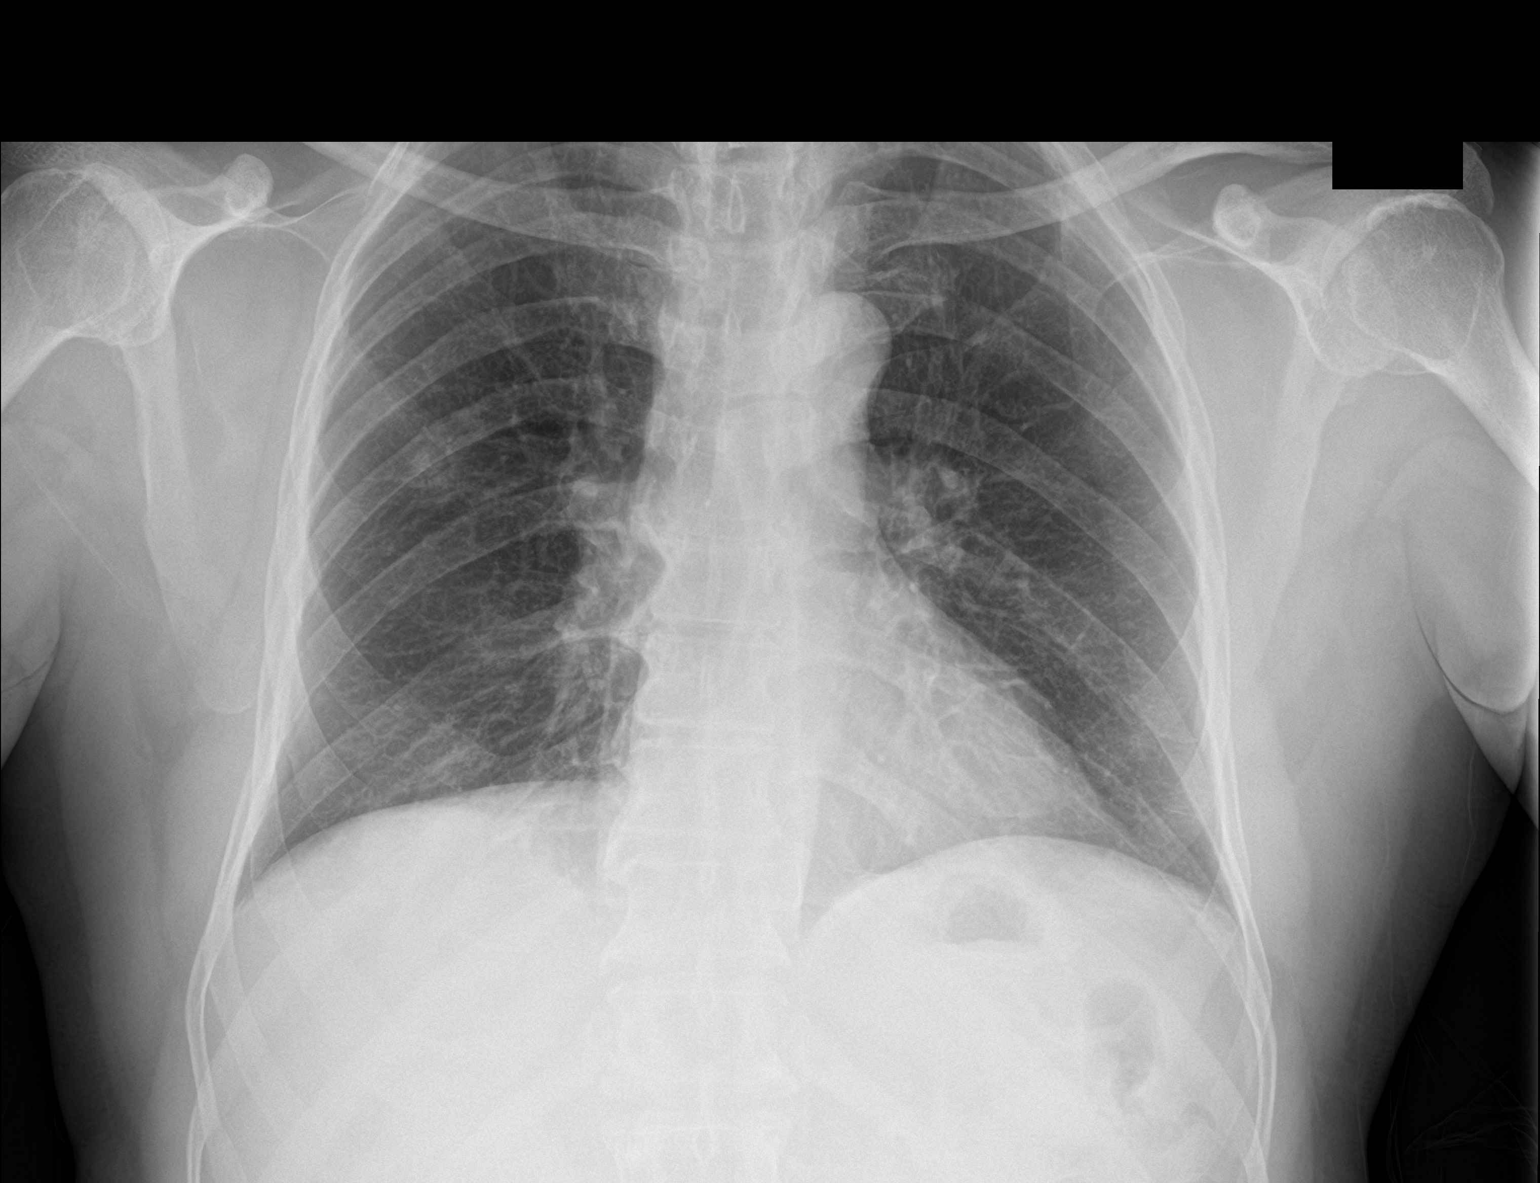

[chest lat]
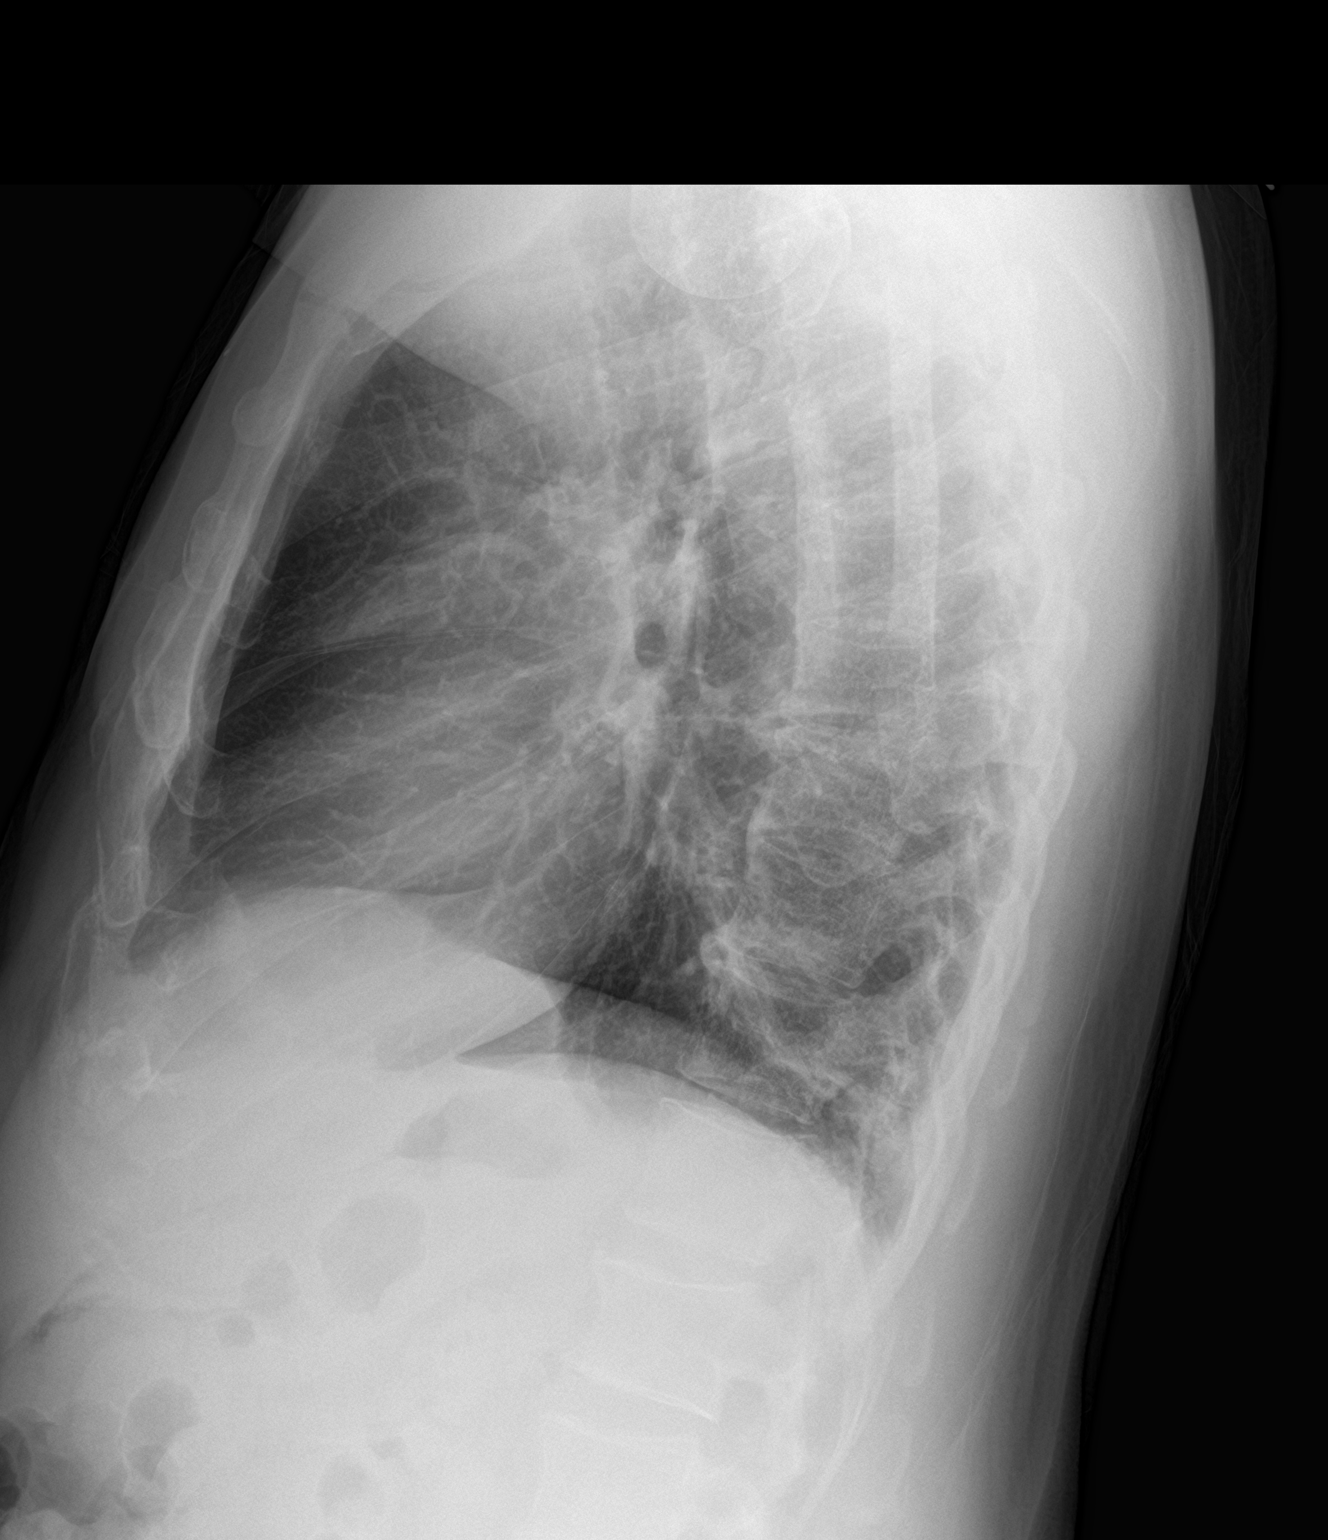

[2 of 2 positions shown; findings below may reference images not displayed]

FINDINGS: The heart size and mediastinal contours are within normal limits. No
pneumothorax or pleural effusion is noted. Left lung is clear. New
nodular density is seen in right upper lobe. The visualized skeletal
structures are unremarkable.
IMPRESSION: New nodular density seen in right upper lobe. CT scan of the chest
is recommended to rule out neoplasm or nodule. No other abnormality
seen in the chest.

## 2019-04-08 ENCOUNTER — Ambulatory Visit: Payer: PPO | Admitting: Internal Medicine

## 2019-04-12 ENCOUNTER — Other Ambulatory Visit: Payer: Self-pay

## 2019-04-12 ENCOUNTER — Encounter: Payer: Self-pay | Admitting: Internal Medicine

## 2019-04-12 ENCOUNTER — Ambulatory Visit: Payer: PPO | Admitting: Internal Medicine

## 2019-04-12 DIAGNOSIS — G4733 Obstructive sleep apnea (adult) (pediatric): Secondary | ICD-10-CM

## 2019-04-12 DIAGNOSIS — J4531 Mild persistent asthma with (acute) exacerbation: Secondary | ICD-10-CM | POA: Diagnosis not present

## 2019-04-12 DIAGNOSIS — R739 Hyperglycemia, unspecified: Secondary | ICD-10-CM | POA: Diagnosis not present

## 2019-04-12 DIAGNOSIS — J4530 Mild persistent asthma, uncomplicated: Secondary | ICD-10-CM | POA: Diagnosis not present

## 2019-04-12 DIAGNOSIS — E7800 Pure hypercholesterolemia, unspecified: Secondary | ICD-10-CM

## 2019-04-12 LAB — BASIC METABOLIC PANEL
BUN: 8 mg/dL (ref 6–23)
CO2: 23 mEq/L (ref 19–32)
Calcium: 8.8 mg/dL (ref 8.4–10.5)
Chloride: 103 mEq/L (ref 96–112)
Creatinine, Ser: 1.05 mg/dL (ref 0.40–1.50)
GFR: 86.5 mL/min (ref >60.00)
Glucose, Bld: 273 mg/dL — ABNORMAL HIGH (ref 70–99)
Potassium: 3.6 mEq/L (ref 3.5–5.1)
Sodium: 135 mEq/L (ref 135–145)

## 2019-04-12 LAB — LIPID PANEL
Cholesterol: 194 mg/dL (ref 0–200)
HDL: 40 mg/dL (ref >39.00)
NonHDL: 154.1
Total CHOL/HDL Ratio: 5
Triglycerides: 335 mg/dL — ABNORMAL HIGH (ref 0.0–149.0)
VLDL: 67 mg/dL — ABNORMAL HIGH (ref 0.0–40.0)

## 2019-04-12 LAB — CBC WITH DIFFERENTIAL/PLATELET
Basophils Absolute: 0 10*3/uL (ref 0.0–0.1)
Basophils Relative: 0.5 % (ref 0.0–3.0)
Eosinophils Absolute: 0.2 10*3/uL (ref 0.0–0.7)
Eosinophils Relative: 3.3 % (ref 0.0–5.0)
HCT: 42.6 % (ref 39.0–52.0)
Hemoglobin: 14.5 g/dL (ref 13.0–17.0)
Lymphocytes Relative: 36.2 % (ref 12.0–46.0)
Lymphs Abs: 2.4 10*3/uL (ref 0.7–4.0)
MCHC: 34.1 g/dL (ref 30.0–36.0)
MCV: 94.5 fl (ref 78.0–100.0)
Monocytes Absolute: 0.6 10*3/uL (ref 0.1–1.0)
Monocytes Relative: 8.8 % (ref 3.0–12.0)
Neutro Abs: 3.4 10*3/uL (ref 1.4–7.7)
Neutrophils Relative %: 51.2 % (ref 43.0–77.0)
Platelets: 224 10*3/uL (ref 150.0–400.0)
RBC: 4.51 Mil/uL (ref 4.22–5.81)
RDW: 12.7 % (ref 11.5–15.5)
WBC: 6.6 10*3/uL (ref 4.0–10.5)

## 2019-04-12 LAB — LDL CHOLESTEROL, DIRECT: Direct LDL: 119 mg/dL

## 2019-04-12 LAB — TSH: TSH: 0.84 u[IU]/mL (ref 0.35–4.50)

## 2019-04-12 MED ORDER — BUDESONIDE-FORMOTEROL FUMARATE 160-4.5 MCG/ACT IN AERO: INHALATION_SPRAY | RESPIRATORY_TRACT | 11 refills | Status: DC

## 2019-04-12 MED ORDER — ALBUTEROL SULFATE HFA 108 (90 BASE) MCG/ACT IN AERS: INHALATION_SPRAY | RESPIRATORY_TRACT | 1 refills | Status: DC

## 2019-04-12 NOTE — Assessment & Plan Note (Addendum)
Onset in childhood 03/10/2015   try dulera 200 2bid > improved  - 07/07/2015   changed to symbicort 160 2bid due to insurance denying dulera  - 08/30/2016  After extensive coaching HFA effectiveness =   75% from baselin 50%  (Ti too short/ took both puffs at once)  - Spirometry 08/30/2016  FEV1 1.84 (68%)  Ratio 77 s curvature  - FENO 08/30/2016  =   7   - 04/12/2019  After extensive coaching inhaler device,  effectiveness =    90%   All goals of chronic asthma control met including optimal function and elimination of symptoms with minimal need for rescue therapy.  Contingencies discussed in full including contacting this office immediately if not controlling the symptoms using the rule of two's.     I had an extended discussion with the patient reviewing all relevant studies completed to date and  lasting 15 to 20 minutes of a 25 minute visit    I performed detailed device teaching using a teach back method which extended face to face time for this visit (see above)  Each maintenance medication was reviewed in detail including emphasizing most importantly the difference between maintenance and prns and under what circumstances the prns are to be triggered using an action plan format that is not reflected in the computer generated alphabetically organized AVS which I have not found useful in most complex patients, especially with respiratory illnesses  Please see AVS for specific instructions unique to this visit that I personally wrote and verbalized to the the pt in detail and then reviewed with pt  by my nurse highlighting any  changes in therapy recommended at today's visit to their plan of care.     

## 2019-04-12 NOTE — Progress Notes (Signed)
Subjective:    Patient ID: Derek Martinez, male   DOB: 1955/05/31      MRN: [id number]     Brief patient profile:  12 yobm never smoker asthma all his life but has periods up to several months of no symptoms and no need for inhalers but more severe sob and on daily meds x late Sept 2016 just consisting of saba and no more advair self referred back to pulmonary clinic p last ov with Dr Halford Chessman 03/2012     History of Present Illness  03/10/2015 acute extended ov/Wert re: sob/ Chief Complaint  Patient presents with  . Acute Visit    VS pt. C/o increase SOB and wheezing w/ exertion, no cough.  really unclear what he has at home as his ventolin is empty and not using advair at all but sob between nebs up to 4-6 x daily  Sob with any activity but confortable at rest assoc with gen chest tightness nothing localized. rec Plan A = Automatic = Dulera 200 Take 2 puffs first thing in am and then another 2 puffs about 12 hours later.  Plan B = Backup Only use your albuterol (proair) as a rescue medication  Plan C= Crisis - only use Nebulizer if you try proair/albuterol 1st and it doesn't work- ok to use up to every 4 hours    07/07/2015  f/u ov/Wert re: chronic asthma/ off dulera 200 x one week  Chief Complaint  Patient presents with  . Follow-up    Breathing has worse since he ran out of Dulera 1 little over 1 wk ago. Insurance does not prefer [employer]. He has been having to use his albuterol inhaler and neb daily.    on dulera no need at all for saba then gradually needed more p ran out  rec Plan A = Automatic = symbicort 160  Take 2 puffs first thing in am and then another 2 puffs about 12 hours later.  Plan B = Backup Only use your albuterol (proair) as a rescue medication Plan C= Crisis - only use Nebulizer if you try proair/albuterol 1st and it doesn't work- ok to use up to every 4 hours  Make appt to see Dr Halford Chessman to follow up on your sleep apnea next available     08/30/2016  f/u ov/Wert re:   Chronic asthma on symb 160 2bid Chief Complaint  Patient presents with  . Follow-up    Pt here for Symbicort refill. He states his breathing has been doing well.  He rarely uses albuterol inhaler and has not used neb.   rarely need saba in any form as long as on symbicort 160 consistently / has not needed neb saba  at all even when out of symbicort  rec Plan A = Automatic = Symbicort 160 Take 2 puffs first thing in am and then another 2 puffs about 12 hours later.  Work on inhaler technique:   Plan B = Backup Only use your albuterol as a rescue medication Plan C = Crisis - only use your albuterol nebulizer       04/06/2018  f/u ov/Wert re:  Chronic asthma  maint on symbicort 160  Chief Complaint  Patient presents with  . Follow-up    Breathing is overall doing well. He rarely uses his albuterol inhaler or neb.   Dyspnea:  Not limited by breathing from desired activities   Cough: no  Sleeping: no longer on cpap x sev years / not aware of noct,  Seems brighter p using it  SABA use: rarely as long as takes symb  02: none   rec No change symbicort 160 Take 2 puffs first thing in am and then another 2 puffs about 12 hours later.  Only use your albuterol as a rescue medication    04/12/2019  Yearly  f/u ov/Wert re: chronic asthma/ taking full care of parents in [place]/no pcp  maint on symb 160 2bid  Chief Complaint  Patient presents with  . Follow-up    Pt states he has been good since last visit. Pt states his asthma is stable with the symbicort.  Dyspnea:  Renovating properties/ steps ok/ totes lumber up steps s sob Cough: minimal non productive assoc with mild hoarseness day > night  Sleeping: no resp symptoms SABA use: rarely  02: none    No obvious day to day or daytime variability or assoc excess/ purulent sputum or mucus plugs or hemoptysis or cp or chest tightness, subjective wheeze or overt sinus or hb symptoms.   Sleeping most nocts  without nocturnal  or early  am exacerbation  of respiratory  c/o's or need for noct saba. Also denies any obvious fluctuation of symptoms with weather or environmental changes or other aggravating or alleviating factors except as outlined above   No unusual exposure hx or h/o childhood pna/  knowledge of premature birth.  Current Allergies, Complete Past Medical History, Past Surgical History, Family History, and Social History were reviewed in [employer] record.  ROS  The following are not active complaints unless bolded Hoarseness mild , sore throat, dysphagia, dental problems, itching, sneezing,  nasal congestion or discharge of excess mucus or purulent secretions, ear ache,   fever, chills, sweats, unintended wt loss or wt gain, classically pleuritic or exertional cp,  orthopnea pnd or arm/hand swelling  or leg swelling, presyncope, palpitations, abdominal pain, anorexia, nausea, vomiting, diarrhea  or change in bowel habits or change in bladder habits, change in stools or change in urine, dysuria, hematuria,  rash, arthralgias, visual complaints, headache, numbness, weakness or ataxia or problems with walking or coordination,  change in mood or  memory.        Current Meds  Medication Sig  . albuterol (PROAIR HFA) 108 (90 Base) MCG/ACT inhaler inhale 2 puffs by mouth every 4 hours  . albuterol (PROVENTIL) (2.5 MG/3ML) 0.083% nebulizer solution inhale contents of 1 vial in nebulizer every 6 hours if needed  . budesonide-formoterol (SYMBICORT) 160-4.5 MCG/ACT inhaler INHALE 2 PUFFS INTO THE LUNGS EVERY 12 HOURS  . dorzolamide-timolol (COSOPT) 22.3-6.8 MG/ML ophthalmic solution Place 1 drop into both eyes 2 (two) times daily.  Marland Kitchen latanoprost (XALATAN) 0.005 % ophthalmic solution As directed              Objective:   Physical Exam  amb bm min hoarse   04/12/2019      173 04/06/2018        174 04/18/2017      175  08/30/2016          173  07/07/2015        173   03/10/15 167 lb 3.2 oz  (75.841 kg)  04/07/13 163 lb (73.936 kg)  04/16/12 174 lb 6.4 oz (79.107 kg)      Vital signs reviewed - Note on arrival 02 sats  96% on RA      HEENT : pt wearing mask not removed for exam due to covid -19 concerns.  NECK :  without JVD/Nodes/TM/ nl carotid upstrokes bilaterally   LUNGS: no acc muscle use,  Nl contour chest which is clear to A and P bilaterally without cough on insp or exp maneuvers   CV:  RRR  no s3 or murmur or increase in P2, and no edema   ABD:  soft and nontender with nl inspiratory excursion in the supine position. No bruits or organomegaly appreciated, bowel sounds nl  MS:  Nl gait/ ext warm without deformities, calf tenderness, cyanosis or clubbing No obvious joint restrictions   SKIN: warm and dry without lesions    NEURO:  alert, approp, nl sensorium with  no motor or cerebellar deficits apparent.     Labs ordered at pt request as as no long has PCP     Chemistry      Component Value Date/Time   NA 135 04/12/2019 1213   K 3.6 04/12/2019 1213   CL 103 04/12/2019 1213   CO2 23 04/12/2019 1213   BUN 8 04/12/2019 1213   CREATININE 1.05 04/12/2019 1213      Component Value Date/Time   CALCIUM 8.8 04/12/2019 1213                             Lab Results  Component Value Date   WBC 6.6 04/12/2019   HGB 14.5 04/12/2019   HCT 42.6 04/12/2019   MCV 94.5 04/12/2019   PLT 224.0 04/12/2019       EOS                                                              0.2                                      04/12/2019      Lab Results  Component Value Date   TSH 0.84 04/12/2019                    Assessment:

## 2019-04-12 NOTE — Patient Instructions (Addendum)
No change symbicort 160 Take 2 puffs first thing in am and then another 2 puffs about 12 hours later.   Only use your albuterol as a rescue medication to be used if you can't catch your breath by resting or doing a relaxed purse lip breathing pattern.  - The less you use it, the better it will work when you need it. - Ok to use up to 2 puffs  every 4 hours if you must but call for immediate appointment if use goes up over your usual need - Don't leave home without it !!  (think of it like the spare tire for your car)   Please remember to go to the lab department   for your tests - we will call you with the results when they are available.       Please schedule a follow up visit in 12  months but call sooner if needed

## 2019-04-13 DIAGNOSIS — H4010X3 Unspecified open-angle glaucoma, severe stage: Secondary | ICD-10-CM | POA: Diagnosis not present

## 2019-04-14 ENCOUNTER — Encounter: Payer: Self-pay | Admitting: Internal Medicine

## 2019-04-14 DIAGNOSIS — R739 Hyperglycemia, unspecified: Secondary | ICD-10-CM | POA: Insufficient documentation

## 2019-04-14 NOTE — Assessment & Plan Note (Signed)
No hypersomonolence/ am ha > no rx needed

## 2019-04-14 NOTE — Assessment & Plan Note (Addendum)
Lab Results  Component Value Date   CHOL 194 04/12/2019   HDL 40.00 04/12/2019   LDLCALC 121 (H) 01/06/2008   LDLDIRECT 119.0 04/12/2019   TRIG 335.0 (H) 04/12/2019   CHOLHDL 5 04/12/2019    slt elevation of triglycerides only > diet / ex    Needs pcp f/u here or in [place]

## 2019-04-14 NOTE — Assessment & Plan Note (Signed)
New onset 04/12/2019 / asymptomatic   This was not fasting > rec diet/ ex and f/u in 2 weeks with fasting bmet/ hgba1c either here or UC in [place] unless identifies PCP in meantime

## 2019-05-21 DIAGNOSIS — Z1152 Encounter for screening for COVID-19: Secondary | ICD-10-CM | POA: Diagnosis not present

## 2019-05-24 ENCOUNTER — Other Ambulatory Visit: Payer: Self-pay | Admitting: Pulmonary Disease

## 2019-05-24 DIAGNOSIS — J4530 Mild persistent asthma, uncomplicated: Secondary | ICD-10-CM

## 2019-06-22 ENCOUNTER — Other Ambulatory Visit: Payer: Self-pay

## 2019-06-22 DIAGNOSIS — Z1211 Encounter for screening for malignant neoplasm of colon: Secondary | ICD-10-CM | POA: Diagnosis not present

## 2019-06-22 DIAGNOSIS — J4531 Mild persistent asthma with (acute) exacerbation: Secondary | ICD-10-CM

## 2019-06-22 MED ORDER — ALBUTEROL SULFATE HFA 108 (90 BASE) MCG/ACT IN AERS: INHALATION_SPRAY | RESPIRATORY_TRACT | 6 refills | Status: AC

## 2019-07-04 DIAGNOSIS — J209 Acute bronchitis, unspecified: Secondary | ICD-10-CM | POA: Diagnosis not present

## 2019-07-12 ENCOUNTER — Ambulatory Visit: Payer: PPO | Attending: Internal Medicine

## 2019-07-12 DIAGNOSIS — Z23 Encounter for immunization: Secondary | ICD-10-CM

## 2019-07-12 NOTE — Progress Notes (Signed)
   Covid-19 Vaccination Clinic  Name:  Derek Martinez    MRN: [id number] DOB: Sep 08, 1955  07/12/2019  Derek Martinez was observed post Covid-19 immunization for 30 minutes based on pre-vaccination screening without incident. He was provided with Vaccine Information Sheet and instruction to access the V-Safe system.   Derek Martinez was instructed to call 911 with any severe reactions post vaccine: Marland Kitchen Difficulty breathing  . Swelling of face and throat  . A fast heartbeat  . A bad rash all over body  . Dizziness and weakness   Immunizations Administered    Name Date Dose VIS Date Route   Pfizer COVID-19 Vaccine 07/12/2019  3:42 PM 0.3 mL 04/09/2019 Intramuscular   Manufacturer: [place]   Lot: [id number]   [place]: [id number]

## 2019-07-15 DIAGNOSIS — H4010X3 Unspecified open-angle glaucoma, severe stage: Secondary | ICD-10-CM | POA: Diagnosis not present

## 2019-08-03 ENCOUNTER — Ambulatory Visit: Payer: PPO

## 2019-10-22 DIAGNOSIS — H4010X3 Unspecified open-angle glaucoma, severe stage: Secondary | ICD-10-CM | POA: Diagnosis not present

## 2019-11-25 IMAGING — DX DG CHEST 2V
2 series · 2 of 2 positions shown · non-contrast
Comparison: 04/16/2017

CLINICAL DATA: Asthma

EXAM:
CHEST - 2 VIEW

[chest pa]
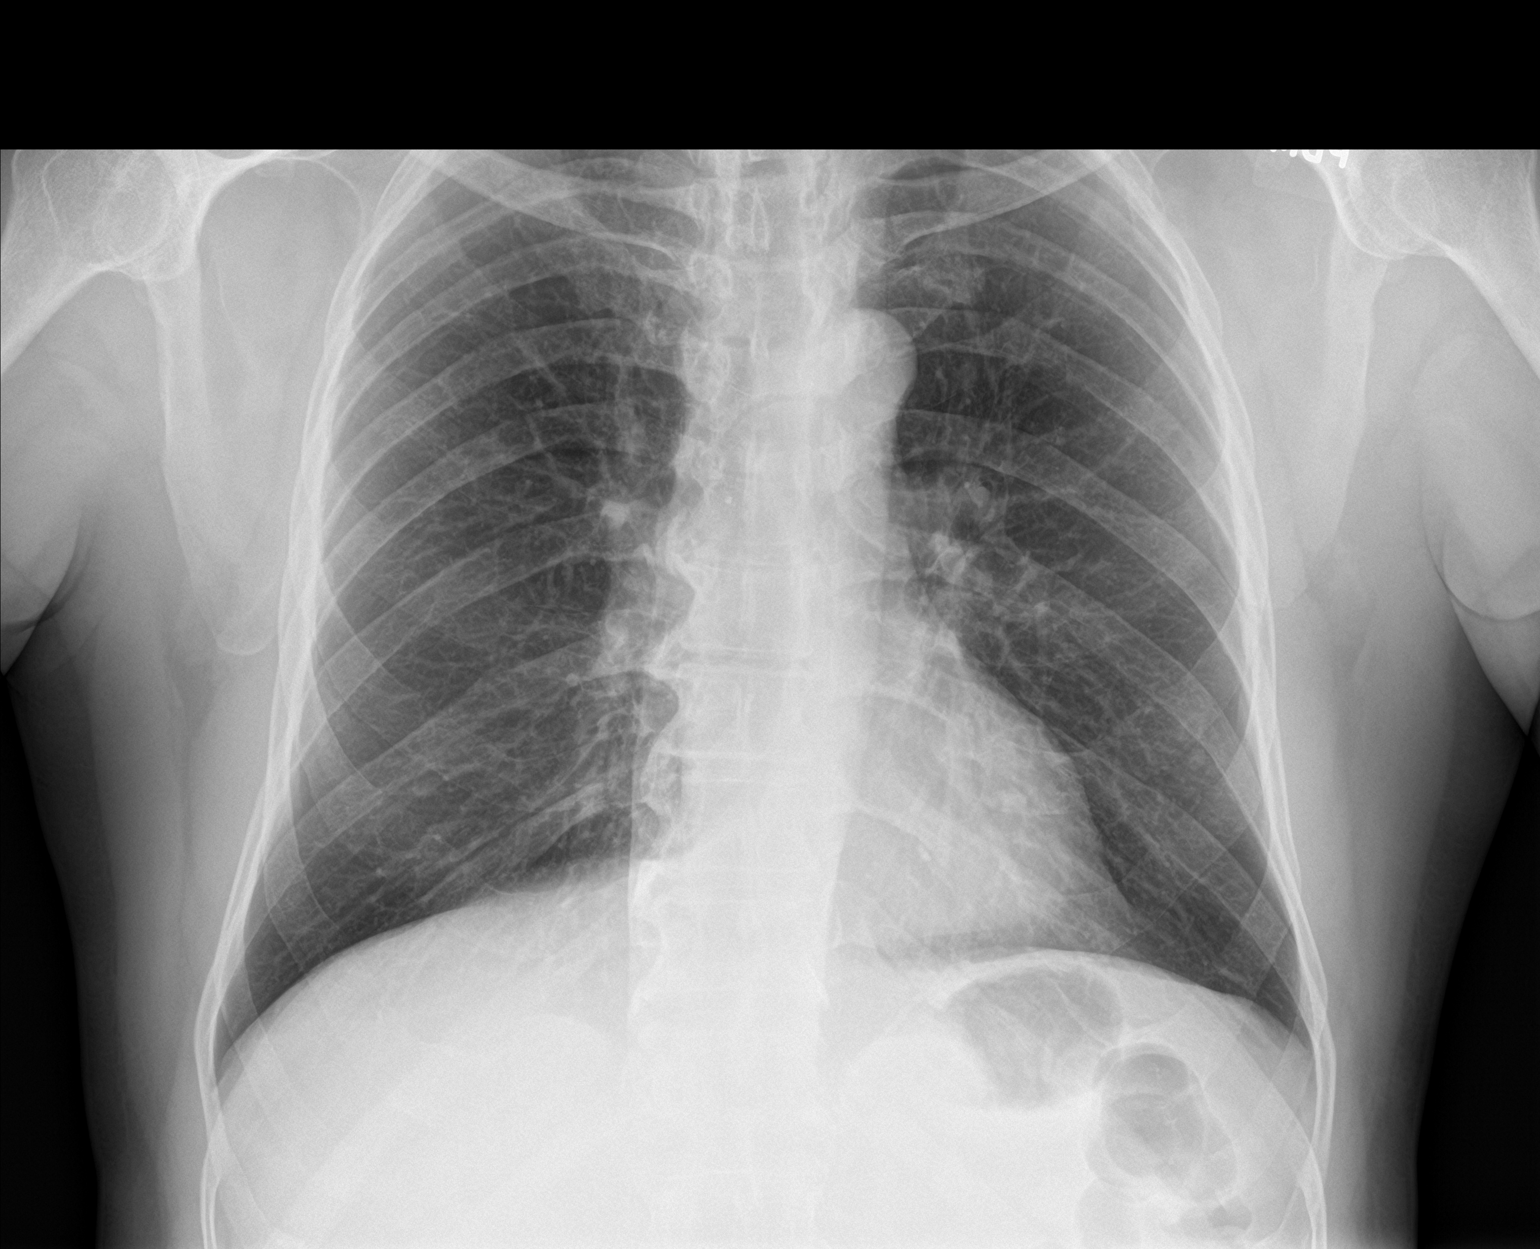

[chest lat]
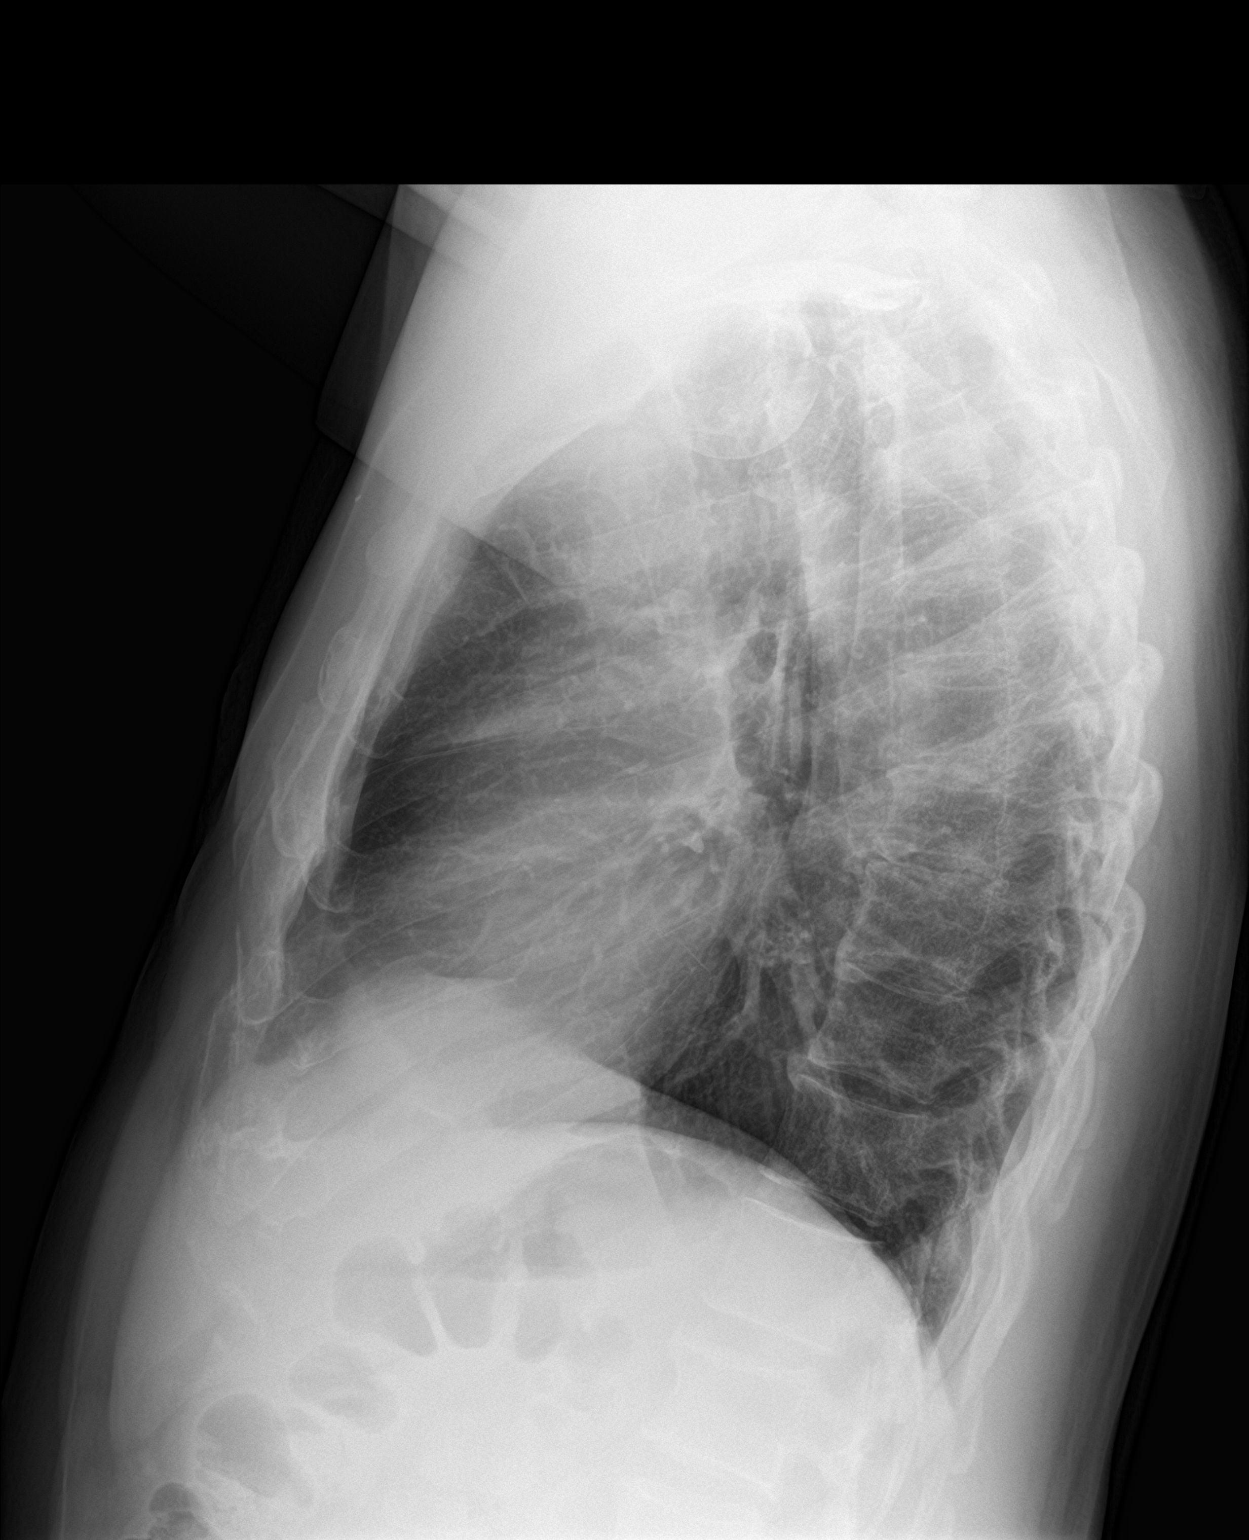

[2 of 2 positions shown; findings below may reference images not displayed]

FINDINGS: The heart size and mediastinal contours are within normal limits.
Both lungs are clear. The visualized skeletal structures are
unremarkable.
IMPRESSION: No active cardiopulmonary disease.

## 2020-01-27 DIAGNOSIS — H4010X3 Unspecified open-angle glaucoma, severe stage: Secondary | ICD-10-CM | POA: Diagnosis not present

## 2020-04-11 ENCOUNTER — Ambulatory Visit: Payer: PPO | Admitting: Primary Care

## 2020-04-11 ENCOUNTER — Ambulatory Visit: Payer: PPO | Admitting: Internal Medicine

## 2020-04-11 NOTE — Progress Notes (Deleted)
@Patient  ID: Derek Martinez, male    DOB: 1955/07/10, 64 y.o.   MRN: [id number]  No chief complaint on file.   Referring provider: No ref. provider found  HPI: 64 year old male, never smoked. PMH significant for allergic asthma, CAP, OSA, hyperglycemia. Patient of Dr. Quentin Cornwall, last seen on 04/11/20.   Previous [facility] pulmonary encounter:  04/12/2019  Yearly  f/u ov/Wert re: chronic asthma/ taking full care of parents in [place]/no pcp  maint on symb 160 2bid  Chief Complaint  Patient presents with  . Follow-up    Pt states he has been good since last visit. Pt states his asthma is stable with the symbicort.  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        Allergies  Allergen Reactions  . Penicillins Shortness Of Breath and Rash  . Ace Inhibitors   . Peanut-Containing Drug Products Swelling    Immunization History  Administered Date(s) Administered  . Influenza,inj,Quad PF,6+ Mos 03/13/2019  . PFIZER SARS-COV-2 Vaccination 07/12/2019  . Pneumococcal Polysaccharide-23 12/05/2017    Past Medical History:  Diagnosis Date  . Allergic asthma   . Allergic rhinitis   . Arthritis    RIGHT SHOULDER  . COPD (chronic obstructive pulmonary disease) (HCC)    PULMOLOGIST-- DR SOOD  . ED (erectile dysfunction)   . End-stage glaucoma    BILATERAL  . History of dislocation of shoulder    RIGHT 2008  . History of prostate cancer    S/P PROSTATECTOMY  04-14-2012  . Legally blind    BILATERAL  . OSA on CPAP    MILD-- PER LAST SLEEP STUDY 10-15-2011   . Wears glasses     Tobacco History: Social History   Tobacco Use  Smoking Status Never Smoker  Smokeless Tobacco Never Used   Counseling given: Not Answered   Outpatient Medications Prior to Visit   Medication Sig Dispense Refill  . albuterol (PROAIR HFA) 108 (90 Base) MCG/ACT inhaler inhale 2 puffs by mouth every 4 hours 18 g 6  . albuterol (PROVENTIL) (2.5 MG/3ML) 0.083% nebulizer solution inhale contents of 1 vial in nebulizer every 6 hours if needed 150 mL 3  . budesonide-formoterol (SYMBICORT) 160-4.5 MCG/ACT inhaler INHALE 2 PUFFS INTO THE LUNGS EVERY 12 HOURS 10.2 g 11  . dorzolamide-timolol (COSOPT) 22.3-6.8 MG/ML ophthalmic solution Place 1 drop into both eyes 2 (two) times daily.    . fluticasone (FLONASE) 50 MCG/ACT nasal spray Place 2 sprays into both nostrils daily. (Patient not taking: Reported on 04/12/2019) 1 g 0  . latanoprost (XALATAN) 0.005 % ophthalmic solution As directed     No facility-administered medications prior to visit.      Review of Systems  Review of Systems   Physical Exam  There were no vitals taken for this visit. Physical Exam   Lab Results:  CBC    Component Value Date/Time   WBC 6.6 04/12/2019 1213   RBC 4.51 04/12/2019 1213   HGB 14.5 04/12/2019 1213   HCT 42.6 04/12/2019 1213   PLT 224.0 04/12/2019 1213   MCV 94.5 04/12/2019 1213   MCH 31.9 08/12/2011 1232   MCHC 34.1 04/12/2019 1213   RDW 12.7 04/12/2019 1213   LYMPHSABS 2.4 04/12/2019 1213   MONOABS 0.6  04/12/2019 1213   EOSABS 0.2 04/12/2019 1213   BASOSABS 0.0 04/12/2019 1213    BMET    Component Value Date/Time   NA 135 04/12/2019 1213   K 3.6 04/12/2019 1213   CL 103 04/12/2019 1213   CO2 23 04/12/2019 1213   GLUCOSE 273 (H) 04/12/2019 1213   BUN 8 04/12/2019 1213   CREATININE 1.05 04/12/2019 1213   CALCIUM 8.8 04/12/2019 1213   GFRNONAA >90 08/12/2011 1232   GFRAA >90 08/12/2011 1232    BNP No results found for: BNP  ProBNP No results found for: PROBNP  Imaging: No results found.   Assessment & Plan:   No problem-specific Assessment & Plan notes found for this encounter.     Martyn Ehrich, NP 04/11/2020

## 2020-05-17 ENCOUNTER — Other Ambulatory Visit: Payer: Self-pay

## 2020-05-17 ENCOUNTER — Ambulatory Visit (HOSPITAL_COMMUNITY)
Admission: EM | Admit: 2020-05-17 | Discharge: 2020-05-17 | Disposition: A | Discharge status: Home / Self Care | Payer: Medicare Other | Attending: Family Medicine | Admitting: Family Medicine

## 2020-05-17 ENCOUNTER — Encounter (HOSPITAL_COMMUNITY): Payer: Self-pay | Admitting: Emergency Medicine

## 2020-05-17 DIAGNOSIS — U071 COVID-19: Secondary | ICD-10-CM

## 2020-05-17 NOTE — ED Triage Notes (Signed)
Patient presents due to being COVID positive.   Patient reports having 2 positive COVID test.   Patient endorses being in quarantine for 5 days.   Patient c/o productive cough, fever, and nasal congestion x 6 d days.   Patient endorses wanting medication for symptoms.

## 2020-05-17 NOTE — ED Provider Notes (Signed)
[facility]   [id number] 05/17/20 Arrival Time: 1212  ASSESSMENT & PLAN:  1. COVID-19 virus infection     Appears fairly well. Just has questions on how long to self-isolate from his 64 year old mother. OTC symptom care as necessary.   Follow-up Information    [facility].   Specialty: Urgent Care Why: If worsening or failing to improve as anticipated. Contact information: [place] [phone]              Reviewed expectations re: course of current medical issues. Questions answered. Outlined signs and symptoms indicating need for more acute intervention. Understanding verbalized. After Visit Summary given.   SUBJECTIVE: History from: patient. Derek Martinez is a 65 y.o. male who reports positive COVID test six days ago. Is congested with productive cough. No SOB or fever. Normal PO intake without n/v/d.    OBJECTIVE:  Vitals:   05/17/20 1414  BP: (!) 142/101  Pulse: 90  Resp: 15  Temp: 97.8 F (36.6 C)  SpO2: 94%    General appearance: alert; no distress Eyes: PERRLA; EOMI; conjunctiva normal HENT: [place]; AT; with nasal congestion Neck: supple  Lungs: speaks full sentences without difficulty; unlabored; CTAB Extremities: no edema Skin: warm and dry Neurologic: normal gait Psychological: alert and cooperative; normal mood and affect     Allergies  Allergen Reactions  . Penicillins Shortness Of Breath and Rash  . Ace Inhibitors   . Peanut-Containing Drug Products Swelling    Past Medical History:  Diagnosis Date  . Allergic asthma   . Allergic rhinitis   . Arthritis    RIGHT SHOULDER  . COPD (chronic obstructive pulmonary disease) (HCC)    PULMOLOGIST-- DR SOOD  . ED (erectile dysfunction)   . End-stage glaucoma    BILATERAL  . History of dislocation of shoulder    RIGHT 2008  . History of prostate cancer    S/P PROSTATECTOMY  04-14-2012  . Legally blind     BILATERAL  . OSA on CPAP    MILD-- PER LAST SLEEP STUDY 10-15-2011   . Wears glasses    Social History   Socioeconomic History  . Marital status: Divorced    Spouse name: Not on file  . Number of children: Not on file  . Years of education: Not on file  . Highest education level: Not on file  Occupational History  . Occupation: Student    Comment: [employer]  Tobacco Use  . Smoking status: Never Smoker  . Smokeless tobacco: Never Used  Substance and Sexual Activity  . Alcohol use: Yes    Comment: RARE  . Drug use: No  . Sexual activity: Not on file  Other Topics Concern  . Not on file  Social History Narrative  . Not on file   Social Determinants of Health   Financial Resource Strain: Not on file  Food Insecurity: Not on file  Transportation Needs: Not on file  Physical Activity: Not on file  Stress: Not on file  Social Connections: Not on file  Intimate Partner Violence: Not on file   Family History  Problem Relation Age of Onset  . Glaucoma Father   . Diabetes Father   . Sleep apnea Father   . Glaucoma Mother    Past Surgical History:  Procedure Laterality Date  . BUNIONECTOMY Left 04/07/2013   Procedure: LAPIDUS BUNIONECTOMY/POSSIBLE ALSTON BUNIONECTOMY;  Surgeon: Francee Piccolo, MD;  Location: [facility];  Service: Podiatry;  Laterality: Left;  . GLAUCOMA SURGERY Bilateral    x 5  (INCLUDING FILTERING BLEB)  . METATARSAL OSTEOTOMY Left 04/07/2013   Procedure: SECOND METATARSAL OSTEOTOMY;  Surgeon: Francee Piccolo, MD;  Location: [facility];  Service: Podiatry;  Laterality: Left;  . OSTECTOMY Left 04/07/2013   Procedure: AKIN OSTECTOMY;  Surgeon: Francee Piccolo, MD;  Location: [facility];  Service: Podiatry;  Laterality: Left;  . PROSTATECTOMY  04-14-2012  [facility]  . PROXIMAL INTERPHALANGEAL FUSION (PIP) Left 04/07/2013   Procedure: PROXIMAL INTERPHALANGEAL FUSION (PIP) SECOND DIGIT;  Surgeon: Francee Piccolo, MD;  Location:  [facility];  Service: Podiatry;  Laterality: Left;     Vanessa Kick, MD 05/17/20 1429

## 2021-04-30 ENCOUNTER — Other Ambulatory Visit: Payer: Self-pay | Admitting: Internal Medicine

## 2021-04-30 DIAGNOSIS — J4530 Mild persistent asthma, uncomplicated: Secondary | ICD-10-CM

## 2021-05-08 ENCOUNTER — Other Ambulatory Visit: Payer: Self-pay | Admitting: Internal Medicine

## 2021-05-08 DIAGNOSIS — J4530 Mild persistent asthma, uncomplicated: Secondary | ICD-10-CM
